# Patient Record
Sex: Female | Born: 1985 | Race: White | Hispanic: No | Marital: Married | Smoking: Never smoker
Health system: Southern US, Community
[De-identification: ages and names within clinical notes are randomized; demographics above are authoritative.]

## PROBLEM LIST (undated history)

## (undated) DIAGNOSIS — D649 Anemia, unspecified: Secondary | ICD-10-CM

## (undated) DIAGNOSIS — N946 Dysmenorrhea, unspecified: Secondary | ICD-10-CM

## (undated) DIAGNOSIS — G43909 Migraine, unspecified, not intractable, without status migrainosus: Secondary | ICD-10-CM

## (undated) DIAGNOSIS — N979 Female infertility, unspecified: Secondary | ICD-10-CM

## (undated) DIAGNOSIS — T7840XA Allergy, unspecified, initial encounter: Secondary | ICD-10-CM

## (undated) DIAGNOSIS — K219 Gastro-esophageal reflux disease without esophagitis: Secondary | ICD-10-CM

## (undated) HISTORY — DX: Dysmenorrhea, unspecified: N94.6

## (undated) HISTORY — DX: Migraine, unspecified, not intractable, without status migrainosus: G43.909

## (undated) HISTORY — DX: Allergy, unspecified, initial encounter: T78.40XA

## (undated) HISTORY — PX: WISDOM TOOTH EXTRACTION: SHX21

---
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
## 2010-04-09 ENCOUNTER — Emergency Department (HOSPITAL_COMMUNITY): Admission: EM | Admit: 2010-04-09 | Discharge: 2010-04-10 | Payer: Self-pay | Admitting: Emergency Medicine

## 2012-03-14 ENCOUNTER — Ambulatory Visit (INDEPENDENT_AMBULATORY_CARE_PROVIDER_SITE_OTHER): Payer: BC Managed Care – PPO | Admitting: Family Medicine

## 2012-03-14 ENCOUNTER — Encounter: Payer: Self-pay | Admitting: Family Medicine

## 2012-03-14 VITALS — BP 122/80 | HR 56 | Temp 98.3°F | Resp 16 | Ht 65.5 in | Wt 231.2 lb

## 2012-03-14 DIAGNOSIS — M62838 Other muscle spasm: Secondary | ICD-10-CM

## 2012-03-14 DIAGNOSIS — G43109 Migraine with aura, not intractable, without status migrainosus: Secondary | ICD-10-CM

## 2012-03-14 DIAGNOSIS — R51 Headache: Secondary | ICD-10-CM

## 2012-03-14 LAB — CBC WITH DIFFERENTIAL/PLATELET
Basophils Absolute: 0 10*3/uL (ref 0.0–0.1)
Basophils Relative: 0 % (ref 0–1)
MCHC: 34.2 g/dL (ref 30.0–36.0)
Monocytes Absolute: 0.5 10*3/uL (ref 0.1–1.0)
Neutro Abs: 4 10*3/uL (ref 1.7–7.7)
Neutrophils Relative %: 59 % (ref 43–77)
Platelets: 354 10*3/uL (ref 150–400)
RDW: 13.5 % (ref 11.5–15.5)

## 2012-03-14 MED ORDER — RIZATRIPTAN BENZOATE 5 MG PO TABS: 5.0000 mg | ORAL_TABLET | ORAL | Status: DC | PRN

## 2012-03-14 MED ORDER — CYCLOBENZAPRINE HCL 5 MG PO TABS: 5.0000 mg | ORAL_TABLET | Freq: Two times a day (BID) | ORAL | Status: AC | PRN

## 2012-03-14 MED ORDER — DIVALPROEX SODIUM 250 MG PO DR TAB: 250.0000 mg | DELAYED_RELEASE_TABLET | Freq: Two times a day (BID) | ORAL | Status: DC

## 2012-03-14 NOTE — Patient Instructions (Signed)

## 2012-03-15 LAB — T4, FREE: Free T4: 1.19 ng/dL (ref 0.80–1.80)

## 2012-03-15 LAB — VITAMIN D 25 HYDROXY (VIT D DEFICIENCY, FRACTURES): Vit D, 25-Hydroxy: 21 ng/mL — ABNORMAL LOW (ref 30–89)

## 2012-03-15 LAB — COMPREHENSIVE METABOLIC PANEL
Albumin: 4.4 g/dL (ref 3.5–5.2)
BUN: 11 mg/dL (ref 6–23)
Calcium: 9.3 mg/dL (ref 8.4–10.5)
Chloride: 104 mEq/L (ref 96–112)
Creat: 0.67 mg/dL (ref 0.50–1.10)
Glucose, Bld: 84 mg/dL (ref 70–99)
Potassium: 4.6 mEq/L (ref 3.5–5.3)

## 2012-03-15 LAB — TSH: TSH: 0.422 u[IU]/mL (ref 0.350–4.500)

## 2012-03-16 ENCOUNTER — Encounter: Payer: Self-pay | Admitting: Family Medicine

## 2012-03-16 DIAGNOSIS — G43109 Migraine with aura, not intractable, without status migrainosus: Secondary | ICD-10-CM | POA: Insufficient documentation

## 2012-03-16 MED ORDER — ERGOCALCIFEROL 1.25 MG (50000 UT) PO CAPS: 50000.0000 [IU] | ORAL_CAPSULE | ORAL | Status: AC

## 2012-03-16 NOTE — Addendum Note (Signed)
Addended by: Dow Adolph B on: 03/16/2012 06:42 PM   Modules accepted: Orders

## 2012-03-16 NOTE — Progress Notes (Signed)
Quick Note:  Please call pt and advise that the following labs are abnormal... Vitamin D is low. I have prescribed Vit D 50,000 IU to be taken once a week. Pick up medication from your pharmacy and take it until there are no RFs. (Then you get OTC Vit D 2000 IU and take this supplement daily)  All other labs are normal.  Copy to pt. ______

## 2012-03-16 NOTE — Progress Notes (Signed)
  Subjective:    Patient ID: Helen Yates, female    DOB: 31-Jan-1986, 26 y.o.   MRN: [id number]  HPI  This 26 y.o. Cauc female is here for evaluation and treatment of Migraines. She was taking Midrin  prescribed at [facility]; side-effect was drowsiness. HAs started in high school; she  experiences scotomata (but no vision loss or numbness) along with nausea and vomiting. Frequency: 2x/ week.  Location: frontal area but sometimes at base of skull- neck massage helps. Father has Migraines as does brother.    Review of Systems As per HPI     Objective:   Physical Exam  Nursing note and vitals reviewed. Constitutional: She is oriented to person, place, and time. She appears well-developed and well-nourished. No distress.  HENT:  Head: Normocephalic and atraumatic.  Right Ear: External ear normal.  Left Ear: External ear normal.  Nose: Nose normal.  Mouth/Throat: Oropharynx is clear and moist.  Eyes: Conjunctivae and EOM are normal. Pupils are equal, round, and reactive to light. No scleral icterus.  Neck: Normal range of motion. Neck supple. No thyromegaly present.       Mild tenderness at nape of neck  Cardiovascular: Normal rate, regular rhythm and normal heart sounds.  Exam reveals no gallop and no friction rub.   No murmur heard. Pulmonary/Chest: Effort normal and breath sounds normal. No respiratory distress. She has no wheezes.  Abdominal: Soft. Bowel sounds are normal. She exhibits no mass. There is no tenderness. There is no guarding.       No organomegaly  Musculoskeletal: Normal range of motion. She exhibits no edema and no tenderness.  Lymphadenopathy:    She has no cervical adenopathy.  Neurological: She is alert and oriented to person, place, and time. She has normal reflexes. No cranial nerve deficit. She exhibits normal muscle tone. Coordination normal.  Skin: Skin is warm and dry. No rash noted. No erythema.  Psychiatric: She has a normal mood and  affect. Her behavior is normal. Judgment and thought content normal.          Assessment & Plan:   1. Headache - neck muscle spasms may be a trigger so trial of muscle relaxant may be helpful Comprehensive metabolic panel, Vitamin D, 25-hydroxy, TSH, T4, Free, CBC with Differential RX: Cyclobenzaprine 5 mg 1 tab bid prn neck pain  2. Migraine with aura  Trial Depakote 250 mg bid for prophylaxis since Headache frequency is 2-3x weekly. She asked about this medication with pregnancy (she is considering conception in 1-2 years). I have advised pre-conception counseling with regards to safety of this medication during pregnancy. She understands.

## 2012-03-17 ENCOUNTER — Encounter: Payer: Self-pay | Admitting: Family Medicine

## 2012-05-27 ENCOUNTER — Ambulatory Visit (INDEPENDENT_AMBULATORY_CARE_PROVIDER_SITE_OTHER): Payer: BC Managed Care – PPO | Admitting: Family Medicine

## 2012-05-27 ENCOUNTER — Encounter: Payer: Self-pay | Admitting: Family Medicine

## 2012-05-27 VITALS — BP 114/72 | HR 64 | Temp 98.0°F | Resp 16

## 2012-05-27 DIAGNOSIS — G43909 Migraine, unspecified, not intractable, without status migrainosus: Secondary | ICD-10-CM

## 2012-05-27 DIAGNOSIS — N912 Amenorrhea, unspecified: Secondary | ICD-10-CM

## 2012-05-27 DIAGNOSIS — E559 Vitamin D deficiency, unspecified: Secondary | ICD-10-CM

## 2012-05-27 DIAGNOSIS — N911 Secondary amenorrhea: Secondary | ICD-10-CM | POA: Insufficient documentation

## 2012-05-27 DIAGNOSIS — G43809 Other migraine, not intractable, without status migrainosus: Secondary | ICD-10-CM

## 2012-05-27 LAB — POCT URINE PREGNANCY: Preg Test, Ur: NEGATIVE

## 2012-05-27 MED ORDER — DIVALPROEX SODIUM 250 MG PO DR TAB: 250.0000 mg | DELAYED_RELEASE_TABLET | Freq: Two times a day (BID) | ORAL | Status: DC

## 2012-05-27 NOTE — Patient Instructions (Addendum)
You can also try OTC Vitamin B12  1 mg daily for migraine headache prevention.  Re: Vitamin D def- continue prescription supplement once a week until you have no refills remaining then get OTC Vit D 2000 IU and take it daily along with a MVI for young women. [employer] is a good brand).

## 2012-05-27 NOTE — Progress Notes (Signed)
S; This 26 y.o. Cauc female has a migraine headache disorder which is responding well to Depakote 250 mg twice a day. Her headaches are less frequent and not as intense. Taking Cyclobenzaprine 5 mg helps with minor headaches.   She and her husband are not planning any children in the immediate future; she did miss menses in July but has skipped months in the past. "Cheap pregnancy test" was negative at home. She would like confirmation testing here.  O: Filed Vitals:   05/27/12 1618  BP: 114/72  Pulse: 64  Temp: 98 F (36.7 C)  Resp: 16   GEN: In NAD COR:RRR; no edema LUNGS: Normal resp rate and effort NEURO: A&O x 3; CNs intact; otherwise, nonfocal.     POCT URINE PREGNANCY      Component Value Range   Preg Test, Ur Negative         A/P: 1. Migraine variant  Continue Depakote 250 mg 1 tab bid Use muscle relaxant prn  2. Amenorrhea, secondary  POCT urine pregnancy- negative    3.  Vitamin D deficiency                                           Continue Vitamin D 50,000 IU  1 capsule once a week

## 2013-03-31 ENCOUNTER — Ambulatory Visit (INDEPENDENT_AMBULATORY_CARE_PROVIDER_SITE_OTHER): Payer: BC Managed Care – PPO | Admitting: Family Medicine

## 2013-03-31 VITALS — BP 134/81 | HR 85 | Temp 98.0°F | Resp 18 | Ht 66.0 in | Wt 248.0 lb

## 2013-03-31 DIAGNOSIS — M545 Low back pain, unspecified: Secondary | ICD-10-CM

## 2013-03-31 MED ORDER — PREDNISONE 20 MG PO TABS: ORAL_TABLET | ORAL | Status: DC

## 2013-03-31 MED ORDER — HYDROCODONE-ACETAMINOPHEN 5-325 MG PO TABS: 1.0000 | ORAL_TABLET | Freq: Four times a day (QID) | ORAL | Status: DC | PRN

## 2013-03-31 NOTE — Patient Instructions (Addendum)

## 2013-03-31 NOTE — Progress Notes (Signed)
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 It's gotten quite severe and she can't go to work at this point. He says the pain is in her low back and is worse when she gets up from a chair or bends over. She recently had a. But the back pain she is describing is different from menstrual cramps.  Patient's had an episode of low back pain almost every month for the last 6 months but is been mild and bearable, resolving a couple days.  Patient's not having any fever, trouble with legs giving out, urination problems, bowel problems, or abdominal pain.  Objective: No acute distress, patient moves slowly and carefully and she negotiates the room. Palpation of the low back reveals some mild tenderness over the LS-spine region bilaterally Skin is without rash Straight-leg raising is mildly positive when seated bilaterally Reflexes are normal in the ankles and knees with symmetry bilaterally. Motor strength is normal in both legs as she is able to stand on her toes, walk on her heels, and left each leg separately.  Assessment: This is easily 2 weight Plan: Advised patient to try to lose some weight and we discussed her diet. We also discussed some exercise with walking in the portal and portal exercises. If she's not getting better in 48 hours she is to return for further workup Low back pain - Plan: predniSONE (DELTASONE) 20 MG tablet, HYDROcodone-acetaminophen (NORCO) 5-325 MG per tablet  Signed, Elvina Sidle, MD

## 2013-07-07 ENCOUNTER — Encounter: Payer: Self-pay | Admitting: Certified Nurse Midwife

## 2013-07-07 ENCOUNTER — Ambulatory Visit (INDEPENDENT_AMBULATORY_CARE_PROVIDER_SITE_OTHER): Payer: BC Managed Care – PPO | Admitting: Certified Nurse Midwife

## 2013-07-07 VITALS — BP 104/66 | HR 64 | Resp 16 | Ht 65.25 in | Wt 247.0 lb

## 2013-07-07 DIAGNOSIS — Z23 Encounter for immunization: Secondary | ICD-10-CM

## 2013-07-07 DIAGNOSIS — Z0000 Encounter for general adult medical examination without abnormal findings: Secondary | ICD-10-CM

## 2013-07-07 DIAGNOSIS — Z01419 Encounter for gynecological examination (general) (routine) without abnormal findings: Secondary | ICD-10-CM

## 2013-07-07 LAB — COMPREHENSIVE METABOLIC PANEL
ALT: 13 U/L (ref 0–35)
AST: 14 U/L (ref 0–37)
Creat: 0.64 mg/dL (ref 0.50–1.10)
Total Bilirubin: 0.5 mg/dL (ref 0.3–1.2)

## 2013-07-07 LAB — POCT URINALYSIS DIPSTICK
Glucose, UA: NEGATIVE
Nitrite, UA: NEGATIVE
Urobilinogen, UA: NEGATIVE

## 2013-07-07 LAB — HEMOGLOBIN A1C: Hgb A1c MFr Bld: 5.6 % (ref <5.7)

## 2013-07-07 LAB — LIPID PANEL
Cholesterol: 162 mg/dL (ref 0–200)
HDL: 53 mg/dL (ref >39)
Total CHOL/HDL Ratio: 3.1 Ratio
VLDL: 23 mg/dL (ref 0–40)

## 2013-07-07 LAB — HEMOGLOBIN, FINGERSTICK: Hemoglobin, fingerstick: 14.8 g/dL (ref 12.0–16.0)

## 2013-07-07 NOTE — Progress Notes (Signed)
27 y.o. G0P0000 Married Caucasian Fe here to establish gyn care and for annual exam. Periods have been in regular except for one missed period per year. Describes periods as painful and heavy for first two days with duration 5 days. Uses Advil 800 mg for first two days. Contraception none in past year and half, trying to conceive. Patient given Depo with rash reaction.   Patient's last menstrual period was 06/14/2013.          Sexually active: yes  The current method of family planning is none.    Exercising: no  exercise Smoker:  no  Health Maintenance: Pap:  2011 MMG:  none Colonoscopy:  none BMD:   none TDaP:  2008 Labs: Poct urine-neg, Hgb-14.8 Self breast exam: not done   reports that she has never smoked. She does not have any smokeless tobacco history on file. She reports that she does not drink alcohol or use illicit drugs.  Past Medical History  Diagnosis Date  . Allergy   . Migraines   . Dysmenorrhea     Past Surgical History  Procedure Laterality Date  . Wisdom tooth extraction      No current outpatient prescriptions on file.   No current facility-administered medications for this visit.    Family History  Problem Relation Age of Onset  . Fibromyalgia Mother   . Osteoporosis Mother   . Migraines Father   . Hypertension Father   . Heart disease Maternal Grandfather   . Diabetes Maternal Grandfather   . Breast cancer Paternal Grandmother     twice  . Diabetes Paternal Grandmother     ROS:  Pertinent items are noted in HPI.  Otherwise, a comprehensive ROS was negative.  Exam:   BP 104/66  Pulse 64  Resp 16  Ht 5' 5.25" (1.657 m)  Wt 247 lb (112.038 kg)  BMI 40.81 kg/m2  LMP 06/14/2013 Height: 5' 5.25" (165.7 cm)  Ht Readings from Last 3 Encounters:  07/07/13 5' 5.25" (1.657 m)  03/31/13 5\' 6"  (1.676 m)  03/14/12 5' 5.5" (1.664 m)    General appearance: alert, cooperative and appears stated age Head: Normocephalic, without obvious abnormality,  atraumatic Neck: no adenopathy, supple, symmetrical, trachea midline and thyroid normal to inspection and palpation Lungs: clear to auscultation bilaterally Breasts: normal appearance, no masses or tenderness, No nipple retraction or dimpling, No nipple discharge or bleeding, No axillary or supraclavicular adenopathy, Taught monthly breast self examination Heart: regular rate and rhythm Abdomen: soft, non-tender; no masses,  no organomegaly Extremities: extremities normal, atraumatic, no cyanosis or edema Skin: Skin color, texture, turgor normal. No rashes or lesions Lymph nodes: Cervical, supraclavicular, and axillary nodes normal. No abnormal inguinal nodes palpated Neurologic: Grossly normal   Pelvic: External genitalia:  no lesions              Urethra:  normal appearing urethra with no masses, tenderness or lesions              Bartholin's and Skene's: normal                 Vagina: normal appearing vagina with normal color and discharge, no lesions              Cervix: normal,  non tender              Pap taken: yes Bimanual Exam:  Uterus:  normal size, contour, position, consistency, mobility, non-tender and mid position  Adnexa: normal adnexa and no mass, fullness, tenderness               Rectovaginal: Confirms               Anus:  normal sphincter tone, no lesions  A:  Well Woman with normal exam  Contraception none desired, attempting pregnancy  History of Migraine with aura  Morbid obesity  P:   Reviewed health and wellness pertinent to exam  Discussed importance of Prenatal vitamins daily with pregnancy and no medication use unless needed  Aware of warning signs and symptoms with migraine  Discussed regular exercise helps with menses and fertility. Encouraged weight loss prior to pregnancy to decrease risk of complications  Pap smear as per guidelines   pap smear taken today  Labs:TSH,CMP,Lipid panel, Hgb A1c  counseled on breast self exam, adequate  intake of calcium and vitamin D, diet and exercise  return annually or prn  An After Visit Summary was printed and given to the patient.

## 2013-07-07 NOTE — Patient Instructions (Signed)
General topics  Next pap or exam is  due in 1 year Take a Women's multivitamin Take 1200 mg. of calcium daily - prefer dietary If any concerns in interim to call back  Breast Self-Awareness Practicing breast self-awareness may pick up problems early, prevent significant medical complications, and possibly save your life. By practicing breast self-awareness, you can become familiar with how your breasts look and feel and if your breasts are changing. This allows you to notice changes early. It can also offer you some reassurance that your breast health is good. One way to learn what is normal for your breasts and whether your breasts are changing is to do a breast self-exam. If you find a lump or something that was not present in the past, it is best to contact your caregiver right away. Other findings that should be evaluated by your caregiver include nipple discharge, especially if it is bloody; skin changes or reddening; areas where the skin seems to be pulled in (retracted); or new lumps and bumps. Breast pain is seldom associated with cancer (malignancy), but should also be evaluated by a caregiver. BREAST SELF-EXAM The best time to examine your breasts is 5 7 days after your menstrual period is over.  ExitCare Patient Information 2013 [employer].   Exercise to Stay Healthy Exercise helps you become and stay healthy. EXERCISE IDEAS AND TIPS Choose exercises that:  You enjoy.  Fit into your day. You do not need to exercise really hard to be healthy. You can do exercises at a slow or medium level and stay healthy. You can:  Stretch before and after working out.  Try yoga, Pilates, or tai chi.  Lift weights.  Walk fast, swim, jog, run, climb stairs, bicycle, dance, or rollerskate.  Take aerobic classes. Exercises that burn about 150 calories:  Running 1  miles in 15 minutes.  Playing volleyball for 45 to 60 minutes.  Washing and waxing a car for 45 to 60  minutes.  Playing touch football for 45 minutes.  Walking 1  miles in 35 minutes.  Pushing a stroller 1  miles in 30 minutes.  Playing basketball for 30 minutes.  Raking leaves for 30 minutes.  Bicycling 5 miles in 30 minutes.  Walking 2 miles in 30 minutes.  Dancing for 30 minutes.  Shoveling snow for 15 minutes.  Swimming laps for 20 minutes.  Walking up stairs for 15 minutes.  Bicycling 4 miles in 15 minutes.  Gardening for 30 to 45 minutes.  Jumping rope for 15 minutes.  Washing windows or floors for 45 to 60 minutes. Document Released: 10/20/2010 Document Revised: 12/10/2011 Document Reviewed: 10/20/2010 ExitCare Patient Information 2013 [employer].   Other topics ( that may be useful information):    Sexually Transmitted Disease Sexually transmitted disease (STD) refers to any infection that is passed from person to person during sexual activity. This may happen by way of saliva, semen, blood, vaginal mucus, or urine. Common STDs include:  Gonorrhea.  Chlamydia.  Syphilis.  HIV/AIDS.  Genital herpes.  Hepatitis B and C.  Trichomonas.  Human papillomavirus (HPV).  Pubic lice. CAUSES  An STD may be spread by bacteria, virus, or parasite. A person can get an STD by:  Sexual intercourse with an infected person.  Sharing sex toys with an infected person.  Sharing needles with an infected person.  Having intimate contact with the genitals, mouth, or rectal areas of an infected person. SYMPTOMS  Some people may not have any symptoms, but   they can still pass the infection to others. Different STDs have different symptoms. Symptoms include:  Painful or bloody urination.  Pain in the pelvis, abdomen, vagina, anus, throat, or eyes.  Skin rash, itching, irritation, growths, or sores (lesions). These usually occur in the genital or anal area.  Abnormal vaginal discharge.  Penile discharge in men.  Soft, flesh-colored skin growths in the  genital or anal area.  Fever.  Pain or bleeding during sexual intercourse.  Swollen glands in the groin area.  Yellow skin and eyes (jaundice). This is seen with hepatitis. DIAGNOSIS  To make a diagnosis, your caregiver may:  Take a medical history.  Perform a physical exam.  Take a specimen (culture) to be examined.  Examine a sample of discharge under a microscope.  Perform blood test TREATMENT   Chlamydia, gonorrhea, trichomonas, and syphilis can be cured with antibiotic medicine.  Genital herpes, hepatitis, and HIV can be treated, but not cured, with prescribed medicines. The medicines will lessen the symptoms.  Genital warts from HPV can be treated with medicine or by freezing, burning (electrocautery), or surgery. Warts may come back.  HPV is a virus and cannot be cured with medicine or surgery.However, abnormal areas may be followed very closely by your caregiver and may be removed from the cervix, vagina, or vulva through office procedures or surgery. If your diagnosis is confirmed, your recent sexual partners need treatment. This is true even if they are symptom-free or have a negative culture or evaluation. They should not have sex until their caregiver says it is okay. HOME CARE INSTRUCTIONS  All sexual partners should be informed, tested, and treated for all STDs.  Take your antibiotics as directed. Finish them even if you start to feel better.  Only take over-the-counter or prescription medicines for pain, discomfort, or fever as directed by your caregiver.  Rest.  Eat a balanced diet and drink enough fluids to keep your urine clear or pale yellow.  Do not have sex until treatment is completed and you have followed up with your caregiver. STDs should be checked after treatment.  Keep all follow-up appointments, Pap tests, and blood tests as directed by your caregiver.  Only use latex condoms and water-soluble lubricants during sexual activity. Do not use  petroleum jelly or oils.  Avoid alcohol and illegal drugs.  Get vaccinated for HPV and hepatitis. If you have not received these vaccines in the past, talk to your caregiver about whether one or both might be right for you.  Avoid risky sex practices that can break the skin. The only way to avoid getting an STD is to avoid all sexual activity.Latex condoms and dental dams (for oral sex) will help lessen the risk of getting an STD, but will not completely eliminate the risk. SEEK MEDICAL CARE IF:   You have a fever.  You have any new or worsening symptoms. Document Released: 12/08/2002 Document Revised: 12/10/2011 Document Reviewed: 12/15/2010 ExitCare Patient Information 2013 [employer].    Domestic Abuse You are being battered or abused if someone close to you hits, pushes, or physically hurts you in any way. You also are being abused if you are forced into activities. You are being sexually abused if you are forced to have sexual contact of any kind. You are being emotionally abused if you are made to feel worthless or if you are constantly threatened. It is important to remember that help is available. No one has the right to abuse you. PREVENTION OF FURTHER   ABUSE  Learn the warning signs of danger. This varies with situations but may include: the use of alcohol, threats, isolation from friends and family, or forced sexual contact. Leave if you feel that violence is going to occur.  If you are attacked or beaten, report it to the police so the abuse is documented. You do not have to press charges. The police can protect you while you or the attackers are leaving. Get the officer's name and badge number and a copy of the report.  Find someone you can trust and tell them what is happening to you: your caregiver, a nurse, clergy member, close friend or family member. Feeling ashamed is natural, but remember that you have done nothing wrong. No one deserves abuse. Document Released:  09/14/2000 Document Revised: 12/10/2011 Document Reviewed: 11/23/2010 ExitCare Patient Information 2013 [employer].    How Much is Too Much Alcohol? Drinking too much alcohol can cause injury, accidents, and health problems. These types of problems can include:   Car crashes.  Falls.  Family fighting (domestic violence).  Drowning.  Fights.  Injuries.  Burns.  Damage to certain organs.  Having a baby with birth defects. ONE DRINK CAN BE TOO MUCH WHEN YOU ARE:  Working.  Pregnant or breastfeeding.  Taking medicines. Ask your doctor.  Driving or planning to drive. If you or someone you know has a drinking problem, get help from a doctor.  Document Released: 07/14/2009 Document Revised: 12/10/2011 Document Reviewed: 07/14/2009 ExitCare Patient Information 2013 [employer].   Smoking Hazards Smoking cigarettes is extremely bad for your health. Tobacco smoke has over 200 known poisons in it. There are over 60 chemicals in tobacco smoke that cause cancer. Some of the chemicals found in cigarette smoke include:   Cyanide.  Benzene.  Formaldehyde.  Methanol (wood alcohol).  Acetylene (fuel used in welding torches).  Ammonia. Cigarette smoke also contains the poisonous gases nitrogen oxide and carbon monoxide.  Cigarette smokers have an increased risk of many serious medical problems and Smoking causes approximately:  90% of all lung cancer deaths in men.  80% of all lung cancer deaths in women.  90% of deaths from chronic obstructive lung disease. Compared with nonsmokers, smoking increases the risk of:  Coronary heart disease by 2 to 4 times.  Stroke by 2 to 4 times.  Men developing lung cancer by 23 times.  Women developing lung cancer by 13 times.  Dying from chronic obstructive lung diseases by 12 times.  . Smoking is the most preventable cause of death and disease in our society.  WHY IS SMOKING ADDICTIVE?  Nicotine is the chemical  agent in tobacco that is capable of causing addiction or dependence.  When you smoke and inhale, nicotine is absorbed rapidly into the bloodstream through your lungs. Nicotine absorbed through the lungs is capable of creating a powerful addiction. Both inhaled and non-inhaled nicotine may be addictive.  Addiction studies of cigarettes and spit tobacco show that addiction to nicotine occurs mainly during the teen years, when young people begin using tobacco products. WHAT ARE THE BENEFITS OF QUITTING?  There are many health benefits to quitting smoking.   Likelihood of developing cancer and heart disease decreases. Health improvements are seen almost immediately.  Blood pressure, pulse rate, and breathing patterns start returning to normal soon after quitting. QUITTING SMOKING   American Lung Association - 1-800-LUNGUSA  American Cancer Society - 1-800-ACS-2345 Document Released: 10/25/2004 Document Revised: 12/10/2011 Document Reviewed: 06/29/2009 ExitCare Patient Information 2013 [employer]   LL[employer].   Stress Management Stress is a state of physical or mental tension that often results from changes in your life or normal routine. Some common causes of stress are:  Death of a loved one.  Injuries or severe illnesses.  Getting fired or changing jobs.  Moving into a new home. Other causes may be:  Sexual problems.  Business or financial losses.  Taking on a large debt.  Regular conflict with someone at home or at work.  Constant tiredness from lack of sleep. It is not just bad things that are stressful. It may be stressful to:  Win the lottery.  Get married.  Buy a new car. The amount of stress that can be easily tolerated varies from person to person. Changes generally cause stress, regardless of the types of change. Too much stress can affect your health. It may lead to physical or emotional problems. Too little stress (boredom) may also become stressful. SUGGESTIONS TO  REDUCE STRESS:  Talk things over with your family and friends. It often is helpful to share your concerns and worries. If you feel your problem is serious, you may want to get help from a professional counselor.  Consider your problems one at a time instead of lumping them all together. Trying to take care of everything at once may seem impossible. List all the things you need to do and then start with the most important one. Set a goal to accomplish 2 or 3 things each day. If you expect to do too many in a single day you will naturally fail, causing you to feel even more stressed.  Do not use alcohol or drugs to relieve stress. Although you may feel better for a short time, they do not remove the problems that caused the stress. They can also be habit forming.  Exercise regularly - at least 3 times per week. Physical exercise can help to relieve that "uptight" feeling and will relax you.  The shortest distance between despair and hope is often a good night's sleep.  Go to bed and get up on time allowing yourself time for appointments without being rushed.  Take a short "time-out" period from any stressful situation that occurs during the day. Close your eyes and take some deep breaths. Starting with the muscles in your face, tense them, hold it for a few seconds, then relax. Repeat this with the muscles in your neck, shoulders, hand, stomach, back and legs.  Take good care of yourself. Eat a balanced diet and get plenty of rest.  Schedule time for having fun. Take a break from your daily routine to relax. HOME CARE INSTRUCTIONS   Call if you feel overwhelmed by your problems and feel you can no longer manage them on your own.  Return immediately if you feel like hurting yourself or someone else. Document Released: 03/13/2001 Document Revised: 12/10/2011 Document Reviewed: 11/03/2007 ExitCare Patient Information 2013 [employer].   

## 2013-07-08 ENCOUNTER — Ambulatory Visit (INDEPENDENT_AMBULATORY_CARE_PROVIDER_SITE_OTHER): Payer: BC Managed Care – PPO | Admitting: Family Medicine

## 2013-07-08 VITALS — BP 118/92 | HR 70 | Temp 98.6°F | Resp 16 | Ht 65.0 in | Wt 246.6 lb

## 2013-07-08 DIAGNOSIS — M79609 Pain in unspecified limb: Secondary | ICD-10-CM

## 2013-07-08 DIAGNOSIS — M79674 Pain in right toe(s): Secondary | ICD-10-CM

## 2013-07-08 DIAGNOSIS — L603 Nail dystrophy: Secondary | ICD-10-CM

## 2013-07-08 DIAGNOSIS — L608 Other nail disorders: Secondary | ICD-10-CM

## 2013-07-08 LAB — IPS PAP TEST WITH REFLEX TO HPV

## 2013-07-08 NOTE — Progress Notes (Signed)
Verbal consent obtained. Digital block with 2% lidocaine plain, 7 CCs total given. Area was prepped and draped in a sterile fashion. Nail lifted and removed en toto. Xeroform placed. Cleansed and dressed.   Patient Instructions  INGROWN TOENAIL . Keep area clean, dry and bandaged for 24 hours. . After 24 hours, remove outer bandage and leave yellow gauze in place. Nuala Alpha toe/foot in warm soapy water for 5-10 minutes, once daily for 5 days. Rebandage toe after each cleaning. . Continue soaks until yellow gauze falls off. . Notify the office if you experience any of the following signs of infection: Swelling, redness, pus drainage, streaking, fever > 101.0 F

## 2013-07-08 NOTE — Progress Notes (Signed)
This chart was scribed for Dr. Milus Glazier, MD by Ardelia Mems, Scribe. The patient was seen in Room 2 and the patient's care was started at 7:23 PM.  Subjective:    Patient ID: Helen Yates, female    DOB: Feb 23, 1986, 27 y.o.   MRN: [id number]  Toe Pain  The incident occurred 6 to 12 hours ago. The incident occurred at home. The injury mechanism was a direct blow (stumped her toe). The pain is present in the right toes (right great toe). The pain is moderate. The pain has been constant since onset. She reports no foreign bodies present. The symptoms are aggravated by palpation, weight bearing and movement. She has tried nothing for the symptoms.    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 She complains today of an injury to her right great toe onset this morning. She states that the toenail has been irritated and as possibly had a fungal infection "for some time". She also states that she injured the toenail this morning, when she was walking and accidentally stumped the toe. She states that she has stumped the toe multiple times in the past. She states that there was bleeding from the toe nail today, after she stumped it.   Past Medical History  Diagnosis Date  . Allergy   . Migraines   . Dysmenorrhea    Past Surgical History  Procedure Laterality Date  . Wisdom tooth extraction      Review of Systems  Musculoskeletal:       Right great toe pain      Objective:   Physical Exam  Nursing note and vitals reviewed. Constitutional: She is oriented to person, place, and time. She appears well-developed and well-nourished.  HENT:  Head: Normocephalic and atraumatic.  Eyes: EOM are normal. Pupils are equal, round, and reactive to light.  Neck: Normal range of motion. Neck supple.  Cardiovascular: Normal rate and regular rhythm.   Pulmonary/Chest: Effort normal. No respiratory distress.  Abdominal: Soft. There is no tenderness.  Neurological: She is alert and oriented to person,  place, and time.  Skin: Skin is warm. No rash noted.       Assessment & Plan:   Toe pain, right  Dystrophic nail    Signed, Elvina Sidle, MD   This chart was scribed in my presence and reviewed by me personally.

## 2013-07-08 NOTE — Patient Instructions (Signed)
INGROWN TOENAIL . Keep area clean, dry and bandaged for 24 hours. . After 24 hours, remove outer bandage and leave yellow gauze in place. . Soak toe/foot in warm soapy water for 5-10 minutes, once daily for 5 days. Rebandage toe after each cleaning. . Continue soaks until yellow gauze falls off. . Notify the office if you experience any of the following signs of infection: Swelling, redness, pus drainage, streaking, fever > 101.0 F   

## 2013-07-08 NOTE — Progress Notes (Signed)
I directly supervised and participated in the procedure and agree with the student's documentation.  

## 2013-07-14 ENCOUNTER — Telehealth: Payer: Self-pay

## 2013-07-14 NOTE — Telephone Encounter (Signed)
lmtcb

## 2013-07-14 NOTE — Telephone Encounter (Signed)
Message copied by Eliezer Bottom on Tue Jul 14, 2013 11:31 AM ------      Message from: Verner Chol      Created: Tue Jul 14, 2013  8:26 AM       Notify Liver, kidney, glucose profile normal,      Lipid panel normal, TSH normal      Pap smear negative.  Patient needs appointment to review ovulation chart in a month, please schedule      02 ------

## 2013-07-14 NOTE — Telephone Encounter (Signed)
Patient notified

## 2013-07-16 NOTE — Progress Notes (Signed)
Note reviewed, agree with plan.  Alia Parsley, MD  

## 2013-08-12 ENCOUNTER — Telehealth: Payer: Self-pay | Admitting: Certified Nurse Midwife

## 2013-08-12 NOTE — Telephone Encounter (Addendum)
Helen Yates with [employer] calling regarding this patients claim. (coordination of benefits not in order).

## 2013-08-14 ENCOUNTER — Ambulatory Visit (INDEPENDENT_AMBULATORY_CARE_PROVIDER_SITE_OTHER): Payer: BC Managed Care – PPO | Admitting: Certified Nurse Midwife

## 2013-08-14 ENCOUNTER — Encounter: Payer: Self-pay | Admitting: Certified Nurse Midwife

## 2013-08-14 VITALS — BP 108/74 | HR 76 | Ht 65.25 in | Wt 249.0 lb

## 2013-08-14 DIAGNOSIS — N912 Amenorrhea, unspecified: Secondary | ICD-10-CM

## 2013-08-14 DIAGNOSIS — Z3002 Counseling and instruction in natural family planning to avoid pregnancy: Secondary | ICD-10-CM

## 2013-08-14 LAB — POCT URINE PREGNANCY: Preg Test, Ur: NEGATIVE

## 2013-08-14 NOTE — Progress Notes (Signed)
27 y.o. Married Caucasian female [id number] here for follow up of ovulation chart evaluation for natural family planning and conception. Periods have been regular now for the past two months. Prior history of irregular cycles for over 6 months. Has been checking temperature and watching cervical mucous for change. Cycles were 31-32 day in length. Patient did not feel she saw a mucous change, but did see a drop in temperature with one cycle around day 16 with a subsequent rise. Period now due in a few days if regular this month. Taking prenatal vitamins.   O: Healthy WD,WN female Affect: Normal, orientation x 3  POCT UPT: negative    A: Evaluation of ovulation graph for attempt at conception planning Ovulation pattern noted on one graph 32 day cycle   P: Discussed findings of consistent with ovulation pattern. Instructed patient to continue with record and confirm ovulation if pattern the same with ovulation kit. Recommend staying in bed prone after intercourse for at least one hour rather up within 10 minutes as before to help with sperm travel. Questions addressed. Review again at 3 months if no conception. Declines rubella screen   RV 3 months, prn   23 minutes spent with patient with >50% of time spent in face to face counseling.

## 2013-08-18 NOTE — Progress Notes (Signed)
Note reviewed, agree with plan.  Terah Robey, MD  

## 2013-10-30 ENCOUNTER — Telehealth: Payer: Self-pay | Admitting: Certified Nurse Midwife

## 2013-10-30 NOTE — Telephone Encounter (Signed)
Pt has not had a cycle since Nov and she has taken 5 pregnancy test they all were negative.

## 2013-10-30 NOTE — Telephone Encounter (Signed)
Spoke with patient. She has been actively trying to conceive per patient for approximately 1.5 years without pregnancy.  Has not had a cycle since 08/19/13 and had negative pregnancy tests at home.  Advised can see MD to discuss fertility and patient agreeable.  Scheduled with Dr. Hyacinth MeekerMiller for 2/3 at 0830. Patient agreeable.  Routing to provider for final review. Patient agreeable to disposition. Will close encounter

## 2013-11-03 ENCOUNTER — Telehealth: Payer: Self-pay | Admitting: Obstetrics & Gynecology

## 2013-11-03 ENCOUNTER — Ambulatory Visit (INDEPENDENT_AMBULATORY_CARE_PROVIDER_SITE_OTHER): Payer: BC Managed Care – PPO | Admitting: Obstetrics & Gynecology

## 2013-11-03 VITALS — BP 114/78 | HR 60 | Resp 12 | Ht 65.25 in | Wt 251.4 lb

## 2013-11-03 DIAGNOSIS — N949 Unspecified condition associated with female genital organs and menstrual cycle: Secondary | ICD-10-CM

## 2013-11-03 DIAGNOSIS — N946 Dysmenorrhea, unspecified: Secondary | ICD-10-CM

## 2013-11-03 DIAGNOSIS — N970 Female infertility associated with anovulation: Secondary | ICD-10-CM

## 2013-11-03 DIAGNOSIS — N938 Other specified abnormal uterine and vaginal bleeding: Secondary | ICD-10-CM

## 2013-11-03 MED ORDER — CLOMIPHENE CITRATE 50 MG PO TABS: ORAL_TABLET | ORAL | Status: DC

## 2013-11-03 NOTE — Telephone Encounter (Signed)
Dr. Hyacinth MeekerMiller, is patient to start cycle then call us with cycle for Clomid order for days 5-9?

## 2013-11-03 NOTE — Telephone Encounter (Signed)
Patient wants to know when she should start Clomid? She was seen for a visit today but is unsure about the question.

## 2013-11-03 NOTE — Progress Notes (Signed)
28 y.o. G0 Married Caucasian Female here for preconceptual counseling.  Patient is unaccompanied today.  Trying for pregnancy 1 1/2 years without success.  Taking PNV.  Cycles not regular.  Last one 11/02/13 and prior was 08/14/13.  Cycles last 4-5 days.  Flow is moderate but very crampy.  Takes ibuprofen several times a day with cycles--usually 2 at a time.  Wonders if this is too much.   Gynecological History:   Menarche: 13.  Cycles never regular even on OCPs Cycle length:  Around 4-5  Length between cycles:  30-90 days GYN infectious disease history:  none  Current Birth Control method: none.  Stopped pills about 2 years ago.  PMH: No hx of DM, HTN, epilepsy, Heart Murmur, or thyroid problems  Medications and allergies reviewed  PSURGHX: None except wisdom tooth extraction  SOC Hx: Married.  Spouse never fathered children. Does really exercise. No smoking/drugs/ETOH [facility] Husband mixer at [employer].  He does smoke.   Partners Past Medical History: No PMHx No PSurg Hx Husband on no medications   Patients FMH:      Partners FMH: Multiple Births No   Multiple Births No Genetic Disorders No   Genetic Disorders No  Sickle cell      Sickle Cell  Hemophilia      Hemophila  Cystic Fibrosis     Cystic Fibrosis  Mental retardation     Mental retardation  Downs Syndrome     Downs Syndrome  Immunization Updates: Rubella Vaccine/ titer:  Yes, immun Chicken Pox / vaccination:  Yes, as a child. Toxoplasmosis exposure (no changing litter box) No Tdap: Yes, 10/01/06 Influenza vaccine who may get pregnant during the flu season:  Yes  Assessment:  Desires pregnancy DUB Dysmenorrhea Probable anovulation Possible PCOS  Plan: Counseling today included discussion of normal preganancy rates 80 % within 1 year, importance of PNVs (which patient is taking), timing of intercourse with ovulation dicussed  Labs:  FSH/LH, fasting insulin level, 17 OH P, testosterone  Plan  SHGM to evaluate uterus, endometrial cavity, and tubal patency  Semen analysis recommended.  Order and instructions provided.  Will start Clomid 50mg  day 5-9 this cycle.  Twin/triplet rate discussed.  Will need Day 23 progesterone on 2/24.  Appropriate anti-inflammatory medications discussed.  ~25 minutes spent with patient >50% of time was in face to face discussion of above.

## 2013-11-03 NOTE — Telephone Encounter (Signed)
Per OV note, LMP 11-02-13. Call to patient, VM has first and last name confirmation.  LM stating that Clomid begins on day 5 so that would be Friday,  call back for instructions.

## 2013-11-03 NOTE — Telephone Encounter (Signed)
Encounter closed.   Thank you.  

## 2013-11-04 LAB — BASIC METABOLIC PANEL
BUN: 7 mg/dL (ref 6–23)
CALCIUM: 9.1 mg/dL (ref 8.4–10.5)
CHLORIDE: 104 meq/L (ref 96–112)
CO2: 26 meq/L (ref 19–32)
Creat: 0.6 mg/dL (ref 0.50–1.10)
GLUCOSE: 71 mg/dL (ref 70–99)
POTASSIUM: 4.2 meq/L (ref 3.5–5.3)
SODIUM: 139 meq/L (ref 135–145)

## 2013-11-04 LAB — FSH/LH
FSH: 7 m[IU]/mL
LH: 6.3 m[IU]/mL

## 2013-11-04 LAB — INSULIN, FASTING: Insulin fasting, serum: 12 u[IU]/mL (ref 3–28)

## 2013-11-04 LAB — TESTOSTERONE: Testosterone: 51 ng/dL (ref 10–70)

## 2013-11-05 ENCOUNTER — Telehealth: Payer: Self-pay | Admitting: *Deleted

## 2013-11-05 DIAGNOSIS — N970 Female infertility associated with anovulation: Secondary | ICD-10-CM

## 2013-11-05 NOTE — Telephone Encounter (Signed)
Patient is returning a call to sabrina

## 2013-11-05 NOTE — Telephone Encounter (Signed)
[facility] (based on cycle starting 11-02-13). LMTCB.

## 2013-11-05 NOTE — Telephone Encounter (Signed)
Left message for patient to call back to go over insurance info and to [facility]

## 2013-11-05 NOTE — Telephone Encounter (Signed)
Patient returned call/ was advised of $35 copay as quoted by insurance company for SHGM/scheduled SHMG/ advised patient of cancellation policy and cancellation fee/ssf

## 2013-11-05 NOTE — Telephone Encounter (Signed)
Please co-sign  

## 2013-11-05 NOTE — Telephone Encounter (Signed)
Patient is returning sallys call °

## 2013-11-07 LAB — 17-HYDROXYPROGESTERONE: 17-OH-Progesterone, LC/MS/MS: 30 ng/dL

## 2013-11-10 ENCOUNTER — Other Ambulatory Visit: Payer: Self-pay | Admitting: Obstetrics & Gynecology

## 2013-11-10 ENCOUNTER — Ambulatory Visit (INDEPENDENT_AMBULATORY_CARE_PROVIDER_SITE_OTHER): Payer: BC Managed Care – PPO | Admitting: Obstetrics & Gynecology

## 2013-11-10 ENCOUNTER — Ambulatory Visit (INDEPENDENT_AMBULATORY_CARE_PROVIDER_SITE_OTHER): Payer: BC Managed Care – PPO

## 2013-11-10 VITALS — BP 122/82 | Ht 65.25 in | Wt 250.2 lb

## 2013-11-10 DIAGNOSIS — N970 Female infertility associated with anovulation: Secondary | ICD-10-CM

## 2013-11-10 DIAGNOSIS — N925 Other specified irregular menstruation: Secondary | ICD-10-CM

## 2013-11-10 DIAGNOSIS — N938 Other specified abnormal uterine and vaginal bleeding: Secondary | ICD-10-CM

## 2013-11-10 DIAGNOSIS — N949 Unspecified condition associated with female genital organs and menstrual cycle: Secondary | ICD-10-CM

## 2013-11-10 NOTE — Progress Notes (Signed)
28 y.o. Bary RichardG0 Marriedfemale here for a pelvic ultrasound with sonohystogram due to DUB and probable amenorrhea.  Pt desirous of pregnancy.  Had lab work done earlier this week.  Reviewed with patient.  Does not look like PCOS.  Started Clomid 50mg  days 5-9 this month.  Patient's last menstrual period was 11/02/2013.  Contraception: none  Technique:  Both transabdominal and transvaginal ultrasound examinations of the pelvis were performed. Transabdominal technique was performed for global imaging of the pelvis including uterus,  ovaries, adnexal regions, and pelvic cul-de-sac.  It was necessary to proceed with endovaginal exam following the abdominal ultrasound transabdominal exam to visualize the endometrium and adnexa.  Color and duplex Doppler ultrasound was utilized to evaluate blood flow to the ovaries.    FINDINGS: UTERUS: 6.1 x 3.8 x 2.9cm EMS: 3.187mm ADNEXA: left ovary 3.6 x 2.2 x 2.3cm Right ovary 4.4 x 2.6 x 2.4cm, dominant follicle is small CUL DE SAC: no free fluid  SHSG:  After obtaining appropriate verbal consent from patient, the cervix was visualized using a speculum, and prepped with betadine.  A tenaculum  was applied to the cervix.  Dilation of the cervix was not necessary. The catheter was passed into the uterus and sterile saline introduced but inadequate dilation of the cavity was seen.  Second attempt made with catheter containing sponge at the tip to act as a dam for the fluid.  Adequate cavity distension without any tubal spill noted.  Patient tolerated all procedure well.  All instruments removed.  Assessment:  DUB, anovulation without evidence of PCOS No evidence of spill from either tube--bilateral tubal blockage vs tubal spasm  Plan:  Will not start Glucophage. Continue clomid this month.  Based on u/s findings today, will probable need to increased Clomid dose if we continue with this. Plan progesterone on 2/24 to determine if medication needs to be increased. Will  either repeat SHGM component with pt taking Mobic/same class medication before procedure vs HSG done with interventional radiology Patient will encourage spouse to do semen analysis  ~25 minutes spent with patient >50% of time was in face to face discussion of above.

## 2013-11-15 ENCOUNTER — Encounter: Payer: Self-pay | Admitting: Obstetrics & Gynecology

## 2013-11-15 DIAGNOSIS — N938 Other specified abnormal uterine and vaginal bleeding: Secondary | ICD-10-CM | POA: Insufficient documentation

## 2013-11-23 ENCOUNTER — Encounter: Payer: Self-pay | Admitting: Obstetrics & Gynecology

## 2013-11-24 ENCOUNTER — Other Ambulatory Visit: Payer: BC Managed Care – PPO

## 2013-11-24 ENCOUNTER — Other Ambulatory Visit: Payer: Self-pay | Admitting: Obstetrics & Gynecology

## 2013-11-24 ENCOUNTER — Other Ambulatory Visit (INDEPENDENT_AMBULATORY_CARE_PROVIDER_SITE_OTHER): Payer: BC Managed Care – PPO

## 2013-11-24 DIAGNOSIS — N970 Female infertility associated with anovulation: Secondary | ICD-10-CM

## 2013-11-25 ENCOUNTER — Other Ambulatory Visit: Payer: Self-pay | Admitting: Obstetrics & Gynecology

## 2013-11-27 LAB — PROGESTERONE: Progesterone: 24.8 ng/mL

## 2013-12-07 ENCOUNTER — Telehealth: Payer: Self-pay | Admitting: Obstetrics & Gynecology

## 2013-12-07 ENCOUNTER — Other Ambulatory Visit: Payer: Self-pay | Admitting: *Deleted

## 2013-12-07 ENCOUNTER — Other Ambulatory Visit: Payer: Self-pay | Admitting: Orthopedic Surgery

## 2013-12-07 DIAGNOSIS — N971 Female infertility of tubal origin: Secondary | ICD-10-CM

## 2013-12-07 NOTE — Telephone Encounter (Signed)
Return call to patient.  See My Chart e-mails.  Patient LMP 12-02-13.  Now on cycle day 6.  Femara was not available yesterday, is it too late to start?  Advised I thought it was too late for this cycle but will confirm with MD.  PUS/SHGM scheduled for 12-10-13, cycle day 9 at 4pm.  Advised patient this is an add on appointment and need to confirm with MD for availability.    Needs medication called that was to take [facility] to [facility].  Please advise.

## 2013-12-07 NOTE — Telephone Encounter (Signed)
Too late for Femara this month.  I want her to take 2 Aleve about 12 hours before u/s and again about a hour before u/s.  Thanks.  Sending this to [place] Anaracy to call as Kennon RoundsSally will be out of office on Tuesday.

## 2013-12-07 NOTE — Telephone Encounter (Signed)
Spoke with pt and she clarified [facility] [address]. [employer] also inquiring about PUS Dr. Hyacinth MeekerMiller mentioned in her email thru MyChart. Advised that Kennon RoundsSally would be calling to schedule. Pt agreeable.

## 2013-12-07 NOTE — Telephone Encounter (Signed)
Patient calling re: MyChart message from Dr. Hyacinth MeekerMiller stating she would sent an RX for Femara to the pharmacy below. However, the pharmacy does not have a record of the RX. The patient requests this be called into the second pharmacy below at this point as it is closer to her work and she was supposed to start the RX yesterday.  [facility] [address]  [facility] [address]

## 2013-12-07 NOTE — Telephone Encounter (Signed)
LMTCB    Dr. Hyacinth MeekerMiller, I wasn't sure how many to dispense or how many refills you wanted for this Femara. [facility] [address].

## 2013-12-08 NOTE — Telephone Encounter (Signed)
Spoke with patient. Message from Dr. Hyacinth MeekerMiller given. She is scheduled for Sonohysterogram on 12/10/13 at 1600. Patient aware will receive call from [place] benefits.   Routing to provider for final review. Patient agreeable to disposition. Will close encounter  Routing to [place] for precert and link order.

## 2013-12-10 ENCOUNTER — Encounter: Payer: Self-pay | Admitting: Obstetrics & Gynecology

## 2013-12-10 ENCOUNTER — Ambulatory Visit (INDEPENDENT_AMBULATORY_CARE_PROVIDER_SITE_OTHER): Payer: BC Managed Care – PPO | Admitting: Obstetrics & Gynecology

## 2013-12-10 ENCOUNTER — Ambulatory Visit (INDEPENDENT_AMBULATORY_CARE_PROVIDER_SITE_OTHER): Payer: BC Managed Care – PPO

## 2013-12-10 VITALS — BP 106/78 | Ht 65.25 in | Wt 249.0 lb

## 2013-12-10 DIAGNOSIS — N970 Female infertility associated with anovulation: Secondary | ICD-10-CM

## 2013-12-10 DIAGNOSIS — N971 Female infertility of tubal origin: Secondary | ICD-10-CM

## 2013-12-10 MED ORDER — LETROZOLE 2.5 MG PO TABS: ORAL_TABLET | ORAL | Status: DC

## 2013-12-10 NOTE — Progress Notes (Signed)
28 y.o. Helen Yates Marriedfemale here for repeat SHGM due to possible tubal occlusion.  At last ultrasound, had difficulty getting cavity to distend and therefore could not see tubal patency.  Attempted to improve this several different ways without success.  Checked if pt's insurance covered HGS and it does not so she is back again to see if I can get enough distension to see if there is or is not tubal disease.   Pt did take Clomid last month with lots of cramping.  She states it stopped as soon as she was off the Clomid.  Will change to femara this month.  On PNV.     Indication:  Question tubal patency  Contraception: none  LMP:  12/02/13  SHSG:  After obtaining appropriate verbal consent from patient, the cervix was visualized using a speculum, and prepped with betadine.  A tenaculum  was applied to the cervix.  Dilation of the cervix was not necessary. SHGM catheter with bulb advanced through cervix.  Bulb inflated with 3cc's of air.  Bulb pulled back against cervix before instilling sterile saline.  The following findings: bilateral tubal patency was noted with spill and fluid in the cul de sac.    Bulb deflated and catheter removed.  All instruments removed.  Pt tolerated procedure well.  Assessment:  Anovulation Tubal patency documented today  Plan:  Pt will proceed with Femara next month.  She will call with onset of cycle.  No progesterone level will be needed.  Rx given for Femara.  All questions answered.  ~15 minutes spent with patient >50% of time was in face to face discussion of above.

## 2014-02-08 ENCOUNTER — Encounter: Payer: Self-pay | Admitting: Obstetrics & Gynecology

## 2014-02-19 ENCOUNTER — Ambulatory Visit (INDEPENDENT_AMBULATORY_CARE_PROVIDER_SITE_OTHER): Payer: BC Managed Care – PPO | Admitting: Obstetrics & Gynecology

## 2014-02-19 ENCOUNTER — Encounter: Payer: Self-pay | Admitting: Obstetrics & Gynecology

## 2014-02-19 VITALS — BP 128/78 | HR 60 | Resp 20 | Ht 65.25 in | Wt 248.6 lb

## 2014-02-19 DIAGNOSIS — N912 Amenorrhea, unspecified: Secondary | ICD-10-CM

## 2014-02-19 LAB — POCT URINE PREGNANCY: Preg Test, Ur: POSITIVE

## 2014-02-19 NOTE — Progress Notes (Deleted)
Subjective:     Patient ID: Helen Yates, female   DOB: 06-04-1986, 28 y.o.   MRN: [id number]  HPI   Review of Systems     Objective:   Physical Exam     Assessment:     ***    Plan:     ***

## 2014-02-19 NOTE — Progress Notes (Signed)
Patient ID: Kymbree Bas, female   DOB: 1985-12-11, 28 y.o.   MRN: [id number]  28 yo G1 MWF here with + UPT at home.  Pt reports she did five of these before calling the office.  No cats in the home.  CMV risk low as not around young children.   Has +UPT here today.   LMP 01/04/14.  EGA 6 5/7 weeks today.  ECD 10/10/14.  Having mild nausea.  No emesis.  Fatigue is really significant for her.  Having a lot of heart burn.  Had some breast tenderness early on but it is milder now.  Also having some dizziness.  D/W pt water consumption.  Is on a PNV.  Tdap 1/08.  Had chicken pox as a child.  Aware flu vaccine safe and recommended.   Fish/shellfish/mercury discuss.  Patient eats fish only occasionally.  Unpasteurized cheese/juices discussed.  Nitrites in foods disucssed.  Exercise and intercourse discussed.    Assessment:  Amenorrhea, +UPT, EGA 6 5/7  Plan:  Cont PNV Increase water Increase tums use Viability PUS  ~15 minutes spent with patient >50% of time was in face to face discussion of above.

## 2014-02-23 ENCOUNTER — Ambulatory Visit (INDEPENDENT_AMBULATORY_CARE_PROVIDER_SITE_OTHER): Payer: BC Managed Care – PPO | Admitting: Obstetrics & Gynecology

## 2014-02-23 ENCOUNTER — Ambulatory Visit (INDEPENDENT_AMBULATORY_CARE_PROVIDER_SITE_OTHER): Payer: BC Managed Care – PPO

## 2014-02-23 ENCOUNTER — Encounter: Payer: Self-pay | Admitting: Obstetrics & Gynecology

## 2014-02-23 ENCOUNTER — Other Ambulatory Visit: Payer: Self-pay | Admitting: Obstetrics & Gynecology

## 2014-02-23 VITALS — BP 120/82 | Ht 65.25 in | Wt 246.0 lb

## 2014-02-23 DIAGNOSIS — N912 Amenorrhea, unspecified: Secondary | ICD-10-CM

## 2014-02-23 LAB — US OB TRANSVAGINAL

## 2014-02-23 MED ORDER — PROMETHAZINE HCL 25 MG PO TABS: 25.0000 mg | ORAL_TABLET | Freq: Four times a day (QID) | ORAL | Status: DC | PRN

## 2014-02-23 NOTE — Progress Notes (Signed)
28 y.o.Marriedfemale here for a pelvic ultrasound for viability.  LMP 01/04/14.  EDC based on u/s 10/11/14.  Conceived on Femara after two months of Clomid.  Clomid caused her significant cramping and therefore was switched to the Femara.  Having signficant nausea.  Hasn't thrown up yet.  Also having a lot of constipation that she feels it due to not being able to really drink a lot of fluid like normal.  Large amounts of fluid, among other things, makes her feel so nauseated.  The worst for her is at night as the nausea prevents her from being able to sleep well, at times.  D/W options for treatment.  Will try phenergan.  Patient's last menstrual period was 01/04/2014.  FINDINGS: UTERUS:  With singleton IUD.  CRL 0.81cm.  C/W gestation at 6 5/7 weeks.  Normal gestational sac.  Normal yolk sac.  FCA at 121 BPM. ADNEXA:   Left ovary normal   Right ovary with corpus luteal cyst 1.5cm CUL DE SAC: no free fluid  Images reviewed with pt.  EGA by ultrasound is 6 5/7 and by LMP 7 1/7.  FCA normal.  Patient and I reviewed transfer of care at this times.  Practice options discussed.  Cystic fibrosis and Down's syndrome testing reviewed.  Pt wished well.  Assessment:  Viable singleton IUP Plan: [facility].  Pt will call when has appt so records can be sent. Phenergan rx to pharmacy.  ~20 minutes spent with patient >50% of time was in face to face discussion of above.

## 2014-02-24 ENCOUNTER — Encounter: Payer: Self-pay | Admitting: Obstetrics & Gynecology

## 2014-02-24 MED ORDER — PROMETHAZINE HCL 25 MG PO TABS: 25.0000 mg | ORAL_TABLET | Freq: Four times a day (QID) | ORAL | Status: DC | PRN

## 2014-02-24 NOTE — Telephone Encounter (Signed)
Spoke with patient and confirmed pharmacy. She would like [facility] [address] in [place]. Advised that rx was sent and ready for her at her [facility].  [facility]   She is agreeable.

## 2014-03-04 DIAGNOSIS — Z0289 Encounter for other administrative examinations: Secondary | ICD-10-CM

## 2014-03-05 LAB — OB RESULTS CONSOLE RUBELLA ANTIBODY, IGM: Rubella: IMMUNE

## 2014-03-05 LAB — OB RESULTS CONSOLE HIV ANTIBODY (ROUTINE TESTING): HIV: NONREACTIVE

## 2014-03-05 LAB — OB RESULTS CONSOLE HEPATITIS B SURFACE ANTIGEN: HEP B S AG: NEGATIVE

## 2014-03-05 LAB — OB RESULTS CONSOLE ABO/RH: RH Type: POSITIVE

## 2014-03-05 LAB — OB RESULTS CONSOLE RPR: RPR: NONREACTIVE

## 2014-03-05 LAB — OB RESULTS CONSOLE ANTIBODY SCREEN: Antibody Screen: NEGATIVE

## 2014-03-26 LAB — OB RESULTS CONSOLE GC/CHLAMYDIA
Chlamydia: NEGATIVE
Gonorrhea: NEGATIVE

## 2014-07-08 ENCOUNTER — Ambulatory Visit: Payer: BC Managed Care – PPO | Admitting: Certified Nurse Midwife

## 2014-07-08 ENCOUNTER — Telehealth: Payer: Self-pay | Admitting: Certified Nurse Midwife

## 2014-07-08 NOTE — Telephone Encounter (Signed)
Pt dnka appointment for aex today. Will cancel appointment and not charge because pt is pregnant.

## 2014-09-18 LAB — OB RESULTS CONSOLE GBS: GBS: NEGATIVE

## 2014-10-01 NOTE — L&D Delivery Note (Signed)
Delivery Note At 3:08 PM a viable and healthy female was delivered via Vaginal, Spontaneous Delivery (Presentation: Right Occiput Anterior).  APGAR: 8, 9; weight  pending.   Placenta status: Intact, Spontaneous.  Cord: 3 vessels with the following complications: None.  Cord pH: na  Anesthesia: Epidural  Episiotomy:  none Lacerations:  second Suture Repair: 2.0 3.0 vicryl rapide Est. Blood Loss (mL):  400  Mom to postpartum.  Baby to Couplet care / Skin to Skin.  Loray Akard J 10/14/2014, 3:31 PM

## 2014-10-11 ENCOUNTER — Encounter (HOSPITAL_COMMUNITY): Payer: Self-pay

## 2014-10-11 ENCOUNTER — Inpatient Hospital Stay (HOSPITAL_COMMUNITY)
Admission: AD | Admit: 2014-10-11 | Discharge: 2014-10-11 | Disposition: A | Discharge status: Home / Self Care | Payer: BC Managed Care – PPO | Source: Ambulatory Visit | Attending: Obstetrics | Admitting: Obstetrics

## 2014-10-11 DIAGNOSIS — Z3A40 40 weeks gestation of pregnancy: Secondary | ICD-10-CM | POA: Insufficient documentation

## 2014-10-11 DIAGNOSIS — O9989 Other specified diseases and conditions complicating pregnancy, childbirth and the puerperium: Secondary | ICD-10-CM | POA: Diagnosis present

## 2014-10-11 LAB — AMNISURE RUPTURE OF MEMBRANE (ROM) NOT AT ARMC: Amnisure ROM: NEGATIVE

## 2014-10-11 NOTE — MAU Note (Signed)
Pt states that she SROM at 2330. Pt states that baby is active and denies bleeding. Pt denies contractions and states she is having irregular tightnings.

## 2014-10-11 NOTE — MAU Note (Signed)
Pt may be discharged to home with follow up in office.

## 2014-10-11 NOTE — Discharge Instructions (Signed)

## 2014-10-11 NOTE — MAU Note (Signed)
Dr. Algie CofferFogelman would like amnisure performed.

## 2014-10-12 ENCOUNTER — Telehealth (HOSPITAL_COMMUNITY): Payer: Self-pay | Admitting: *Deleted

## 2014-10-12 NOTE — Telephone Encounter (Signed)
Preadmission screen  

## 2014-10-14 ENCOUNTER — Inpatient Hospital Stay (HOSPITAL_COMMUNITY): Payer: BC Managed Care – PPO | Admitting: Anesthesiology

## 2014-10-14 ENCOUNTER — Inpatient Hospital Stay (HOSPITAL_COMMUNITY)
Admission: AD | Admit: 2014-10-14 | Discharge: 2014-10-16 | DRG: 775 | Disposition: A | Discharge status: Home / Self Care | Payer: BC Managed Care – PPO | Source: Ambulatory Visit | Attending: Obstetrics | Admitting: Obstetrics

## 2014-10-14 ENCOUNTER — Encounter (HOSPITAL_COMMUNITY): Payer: Self-pay | Admitting: *Deleted

## 2014-10-14 DIAGNOSIS — D649 Anemia, unspecified: Secondary | ICD-10-CM | POA: Diagnosis present

## 2014-10-14 DIAGNOSIS — Z3A40 40 weeks gestation of pregnancy: Secondary | ICD-10-CM | POA: Diagnosis present

## 2014-10-14 DIAGNOSIS — Z6841 Body Mass Index (BMI) 40.0 and over, adult: Secondary | ICD-10-CM | POA: Diagnosis not present

## 2014-10-14 DIAGNOSIS — O2613 Low weight gain in pregnancy, third trimester: Secondary | ICD-10-CM | POA: Diagnosis present

## 2014-10-14 DIAGNOSIS — O99214 Obesity complicating childbirth: Secondary | ICD-10-CM | POA: Diagnosis present

## 2014-10-14 DIAGNOSIS — O9081 Anemia of the puerperium: Secondary | ICD-10-CM | POA: Diagnosis present

## 2014-10-14 DIAGNOSIS — IMO0001 Reserved for inherently not codable concepts without codable children: Secondary | ICD-10-CM

## 2014-10-14 HISTORY — DX: Female infertility, unspecified: N97.9

## 2014-10-14 LAB — CBC
HEMATOCRIT: 34 % — AB (ref 36.0–46.0)
HEMOGLOBIN: 11.7 g/dL — AB (ref 12.0–15.0)
MCH: 30.2 pg (ref 26.0–34.0)
MCHC: 34.4 g/dL (ref 30.0–36.0)
MCV: 87.9 fL (ref 78.0–100.0)
Platelets: 240 10*3/uL (ref 150–400)
RBC: 3.87 MIL/uL (ref 3.87–5.11)
RDW: 13.7 % (ref 11.5–15.5)
WBC: 10.7 10*3/uL — ABNORMAL HIGH (ref 4.0–10.5)

## 2014-10-14 LAB — TYPE AND SCREEN
ABO/RH(D): B POS
ANTIBODY SCREEN: NEGATIVE

## 2014-10-14 LAB — ABO/RH: ABO/RH(D): B POS

## 2014-10-14 MED ORDER — PHENYLEPHRINE 40 MCG/ML (10ML) SYRINGE FOR IV PUSH (FOR BLOOD PRESSURE SUPPORT)
80.0000 ug | PREFILLED_SYRINGE | INTRAVENOUS | Status: DC | PRN
  Filled 2014-10-14: qty 20
  Filled 2014-10-14: qty 2

## 2014-10-14 MED ORDER — ACETAMINOPHEN 325 MG PO TABS: 650.0000 mg | ORAL_TABLET | ORAL | Status: DC | PRN

## 2014-10-14 MED ORDER — EPHEDRINE 5 MG/ML INJ
10.0000 mg | INTRAVENOUS | Status: DC | PRN
  Filled 2014-10-14: qty 2

## 2014-10-14 MED ORDER — ZOLPIDEM TARTRATE 5 MG PO TABS: 5.0000 mg | ORAL_TABLET | Freq: Every evening | ORAL | Status: DC | PRN

## 2014-10-14 MED ORDER — IBUPROFEN 600 MG PO TABS
600.0000 mg | ORAL_TABLET | Freq: Four times a day (QID) | ORAL | Status: DC
  Administered 2014-10-15 – 2014-10-16 (×8): 600 mg via ORAL
  Filled 2014-10-14 (×8): qty 1

## 2014-10-14 MED ORDER — DIBUCAINE 1 % RE OINT: 1.0000 "application " | TOPICAL_OINTMENT | RECTAL | Status: DC | PRN

## 2014-10-14 MED ORDER — LIDOCAINE HCL (PF) 1 % IJ SOLN
INTRAMUSCULAR | Status: DC | PRN
  Administered 2014-10-14 (×2): 4 mL

## 2014-10-14 MED ORDER — LACTATED RINGERS IV SOLN
500.0000 mL | INTRAVENOUS | Status: DC | PRN
  Administered 2014-10-14: 1000 mL via INTRAVENOUS

## 2014-10-14 MED ORDER — LACTATED RINGERS IV SOLN
INTRAVENOUS | Status: DC
  Administered 2014-10-14: 11:00:00 via INTRAVENOUS

## 2014-10-14 MED ORDER — OXYCODONE-ACETAMINOPHEN 5-325 MG PO TABS
2.0000 | ORAL_TABLET | ORAL | Status: DC | PRN
  Administered 2014-10-15 – 2014-10-16 (×2): 2 via ORAL
  Filled 2014-10-14 (×2): qty 2

## 2014-10-14 MED ORDER — ONDANSETRON HCL 4 MG/2ML IJ SOLN: 4.0000 mg | Freq: Four times a day (QID) | INTRAMUSCULAR | Status: DC | PRN

## 2014-10-14 MED ORDER — METHYLERGONOVINE MALEATE 0.2 MG PO TABS: 0.2000 mg | ORAL_TABLET | ORAL | Status: DC | PRN

## 2014-10-14 MED ORDER — FENTANYL 2.5 MCG/ML BUPIVACAINE 1/10 % EPIDURAL INFUSION (WH - ANES)
14.0000 mL/h | INTRAMUSCULAR | Status: DC | PRN
  Administered 2014-10-14: 14 mL/h via EPIDURAL
  Filled 2014-10-14: qty 125

## 2014-10-14 MED ORDER — LIDOCAINE HCL (PF) 1 % IJ SOLN
30.0000 mL | INTRAMUSCULAR | Status: AC | PRN
  Administered 2014-10-14: 30 mL via SUBCUTANEOUS
  Filled 2014-10-14: qty 30

## 2014-10-14 MED ORDER — SENNOSIDES-DOCUSATE SODIUM 8.6-50 MG PO TABS
2.0000 | ORAL_TABLET | ORAL | Status: DC
  Administered 2014-10-15 – 2014-10-16 (×2): 2 via ORAL
  Filled 2014-10-14 (×2): qty 2

## 2014-10-14 MED ORDER — BENZOCAINE-MENTHOL 20-0.5 % EX AERO
1.0000 "application " | INHALATION_SPRAY | CUTANEOUS | Status: DC | PRN
  Administered 2014-10-14 – 2014-10-16 (×2): 1 via TOPICAL
  Filled 2014-10-14 (×2): qty 56

## 2014-10-14 MED ORDER — LANOLIN HYDROUS EX OINT: TOPICAL_OINTMENT | CUTANEOUS | Status: DC | PRN

## 2014-10-14 MED ORDER — FENTANYL 2.5 MCG/ML BUPIVACAINE 1/10 % EPIDURAL INFUSION (WH - ANES)
INTRAMUSCULAR | Status: DC | PRN
  Administered 2014-10-14: 14 mL/h via EPIDURAL

## 2014-10-14 MED ORDER — LACTATED RINGERS IV SOLN: 500.0000 mL | Freq: Once | INTRAVENOUS | Status: DC

## 2014-10-14 MED ORDER — CITRIC ACID-SODIUM CITRATE 334-500 MG/5ML PO SOLN
30.0000 mL | ORAL | Status: DC | PRN
  Administered 2014-10-14: 30 mL via ORAL
  Filled 2014-10-14: qty 15

## 2014-10-14 MED ORDER — ONDANSETRON HCL 4 MG/2ML IJ SOLN
4.0000 mg | INTRAMUSCULAR | Status: DC | PRN
Start: 2014-10-14 — End: 2014-10-16

## 2014-10-14 MED ORDER — PHENYLEPHRINE 40 MCG/ML (10ML) SYRINGE FOR IV PUSH (FOR BLOOD PRESSURE SUPPORT)
80.0000 ug | PREFILLED_SYRINGE | INTRAVENOUS | Status: DC | PRN
  Filled 2014-10-14: qty 2

## 2014-10-14 MED ORDER — DIPHENHYDRAMINE HCL 25 MG PO CAPS: 25.0000 mg | ORAL_CAPSULE | Freq: Four times a day (QID) | ORAL | Status: DC | PRN

## 2014-10-14 MED ORDER — DIPHENHYDRAMINE HCL 50 MG/ML IJ SOLN: 12.5000 mg | INTRAMUSCULAR | Status: DC | PRN

## 2014-10-14 MED ORDER — TETANUS-DIPHTH-ACELL PERTUSSIS 5-2.5-18.5 LF-MCG/0.5 IM SUSP: 0.5000 mL | Freq: Once | INTRAMUSCULAR | Status: DC

## 2014-10-14 MED ORDER — OXYTOCIN BOLUS FROM INFUSION: 500.0000 mL | INTRAVENOUS | Status: DC

## 2014-10-14 MED ORDER — METHYLERGONOVINE MALEATE 0.2 MG/ML IJ SOLN: 0.2000 mg | INTRAMUSCULAR | Status: DC | PRN

## 2014-10-14 MED ORDER — PRENATAL MULTIVITAMIN CH
1.0000 | ORAL_TABLET | Freq: Every day | ORAL | Status: DC
  Administered 2014-10-15 – 2014-10-16 (×2): 1 via ORAL
  Filled 2014-10-14 (×2): qty 1

## 2014-10-14 MED ORDER — OXYCODONE-ACETAMINOPHEN 5-325 MG PO TABS
1.0000 | ORAL_TABLET | ORAL | Status: DC | PRN
  Administered 2014-10-15 – 2014-10-16 (×4): 1 via ORAL
  Filled 2014-10-14 (×4): qty 1

## 2014-10-14 MED ORDER — OXYCODONE-ACETAMINOPHEN 5-325 MG PO TABS: 2.0000 | ORAL_TABLET | ORAL | Status: DC | PRN

## 2014-10-14 MED ORDER — SIMETHICONE 80 MG PO CHEW: 80.0000 mg | CHEWABLE_TABLET | ORAL | Status: DC | PRN

## 2014-10-14 MED ORDER — BUPIVACAINE HCL (PF) 0.25 % IJ SOLN
INTRAMUSCULAR | Status: DC | PRN
  Administered 2014-10-14 (×2): 5 mL

## 2014-10-14 MED ORDER — OXYCODONE-ACETAMINOPHEN 5-325 MG PO TABS: 1.0000 | ORAL_TABLET | ORAL | Status: DC | PRN

## 2014-10-14 MED ORDER — OXYTOCIN 40 UNITS IN LACTATED RINGERS INFUSION - SIMPLE MED
62.5000 mL/h | INTRAVENOUS | Status: DC
  Administered 2014-10-14: 999 mL/h via INTRAVENOUS
  Filled 2014-10-14: qty 1000

## 2014-10-14 MED ORDER — WITCH HAZEL-GLYCERIN EX PADS: 1.0000 "application " | MEDICATED_PAD | CUTANEOUS | Status: DC | PRN

## 2014-10-14 MED ORDER — FLEET ENEMA 7-19 GM/118ML RE ENEM: 1.0000 | ENEMA | RECTAL | Status: DC | PRN

## 2014-10-14 MED ORDER — ONDANSETRON HCL 4 MG PO TABS: 4.0000 mg | ORAL_TABLET | ORAL | Status: DC | PRN

## 2014-10-14 NOTE — MAU Note (Signed)
Contractions started at 0430, seem back to back now.  Small amt of blood initially. No leaking. Was 2cm when last checked.

## 2014-10-14 NOTE — Anesthesia Procedure Notes (Signed)
Epidural Patient location during procedure: OB Start time: 10/14/2014 10:42 AM End time: 10/14/2014 10:49 AM  Staffing Anesthesiologist: Felipe DroneJUDD, Jaycen Vercher JENNETTE Performed by: anesthesiologist   Preanesthetic Checklist Completed: patient identified, site marked, surgical consent, pre-op evaluation, timeout performed, IV checked, risks and benefits discussed and monitors and equipment checked  Epidural Patient position: sitting Prep: site prepped and draped and DuraPrep Patient monitoring: continuous pulse ox and blood pressure Approach: midline Location: L3-L4 Injection technique: LOR saline  Needle:  Needle type: Tuohy  Needle gauge: 17 G Needle length: 9 cm and 9 Needle insertion depth: 8 and 8 cm Catheter type: closed end flexible Catheter size: 19 Gauge Catheter at skin depth: 13 cm Test dose: negative  Assessment Events: blood not aspirated, injection not painful, no injection resistance, negative IV test and no paresthesia  Additional Notes Patient identified. Risks/Benefits/Options discussed with patient including but not limited to bleeding, infection, nerve damage, paralysis, failed block, incomplete pain control, headache, blood pressure changes, nausea, vomiting, reactions to medication both or allergic, itching and postpartum back pain. Confirmed with bedside nurse the patient's most recent platelet count. Confirmed with patient that they are not currently taking any anticoagulation, have any bleeding history or any family history of bleeding disorders. Patient expressed understanding and wished to proceed. All questions were answered. Sterile technique was used throughout the entire procedure. Please see nursing notes for vital signs. Test dose was given through epidural catheter and negative prior to continuing to dose epidural or start infusion. Warning signs of high block given to the patient including shortness of breath, tingling/numbness in hands, complete motor block,  or any concerning symptoms with instructions to call for help. Patient was given instructions on fall risk and not to get out of bed. All questions and concerns addressed with instructions to call with any issues or inadequate analgesia.

## 2014-10-14 NOTE — H&P (Signed)
Helen Yates is a 29 y.o. G1P0000 at 4153w3d presenting for active labor. Pt notes contractions started at 4 am. Good fetal movement, No vaginal bleeding, not leaking fluid  [facility] since 6 wks - Dated by LMP c/w 6 wk u/s - h/o infertility, Femara conception - N/V/ GERD, on Diclegis, anti-reflux meds all preg with poor appetite and low wt gain - fetal growth- 39 wks- 7'4, 64%, nl AFI, vtx - obese- 9# wt gain. abnl early DS with nl 3 hr then normal 28 wk DS   Prenatal Transfer Tool  Maternal Diabetes: No Genetic Screening: Normal Maternal Ultrasounds/Referrals: Normal Fetal Ultrasounds or other Referrals: None Maternal Substance Abuse: No Significant Maternal Medications: None Significant Maternal Lab Results: None     OB History    Gravida Para Term Preterm AB TAB SAB Ectopic Multiple Living   1 0 0 0 0 0 0 0 0 0      Past Medical History  Diagnosis Date  . Allergy   . Migraines   . Dysmenorrhea    Past Surgical History  Procedure Laterality Date  . Wisdom tooth extraction     Family History: family history includes Breast cancer in her paternal grandmother; Diabetes in her maternal grandfather and paternal grandmother; Fibromyalgia in her mother; Heart disease in her maternal grandfather; Hypertension in her father; Migraines in her father; Osteoporosis in her mother. Social History:  reports that she has never smoked. She does not have any smokeless tobacco history on file. She reports that she does not drink alcohol or use illicit drugs.   Meds: Protonix, Fiorcet, Magnesium, Diclegis  Review of Systems - Negative except discomfort of pregnancy     Last menstrual period 01/04/2014. Filed Vitals:   10/14/14 0902 10/14/14 0946 10/14/14 1011  BP: 144/97 137/88 148/89  Pulse: 73 67 68  Temp: 98.2 F (36.8 C)    TempSrc: Oral    Resp: 18    Height: 5' 4.5" (1.638 m)    Weight: 114.76 kg (253 lb)        Physical Exam:  No distress, uncomfortable with ctx Abd: gravid, NT LE: NT, no edema Cvx: 4/90 per RN   Prenatal labs: ABO, Rh: B/Positive/-- (06/05 0000) Antibody: Negative (06/05 0000) Rubella:  immune RPR: Nonreactive (06/05 0000)  HBsAg: Negative (06/05 0000)  HIV: Non-reactive (06/05 0000)  GBS: Negative (12/19 0000)  1 hr Glucola 109 Genetic screening nl quad Anatomy US normal   Assessment/Plan: 29 y.o. G1P0000 at  40'3 Active labor. Expectant management Plans epidural  Helen Yates A. 10/12/2014, 8:46 PM

## 2014-10-14 NOTE — Anesthesia Preprocedure Evaluation (Addendum)
Anesthesia Evaluation  Patient identified by MRN, date of birth, ID band Patient awake    Reviewed: Allergy & Precautions, Patient's Chart, lab work & pertinent test results  Airway Mallampati: III  TM Distance: >3 FB Neck ROM: Full    Dental no notable dental hx. (+) Teeth Intact   Pulmonary neg pulmonary ROS,  breath sounds clear to auscultation  Pulmonary exam normal       Cardiovascular negative cardio ROS  Rhythm:Regular Rate:Normal     Neuro/Psych  Headaches, negative psych ROS   GI/Hepatic Neg liver ROS, GERD-  Medicated and Controlled,  Endo/Other  Morbid obesity  Renal/GU negative Renal ROS  negative genitourinary   Musculoskeletal negative musculoskeletal ROS (+)   Abdominal (+) + obese,   Peds  Hematology  (+) anemia ,   Anesthesia Other Findings   Reproductive/Obstetrics (+) Pregnancy                             Anesthesia Physical Anesthesia Plan  ASA: III  Anesthesia Plan: Epidural   Post-op Pain Management:    Induction:   Airway Management Planned: Natural Airway  Additional Equipment:   Intra-op Plan:   Post-operative Plan:   Informed Consent: I have reviewed the patients History and Physical, chart, labs and discussed the procedure including the risks, benefits and alternatives for the proposed anesthesia with the patient or authorized representative who has indicated his/her understanding and acceptance.     Plan Discussed with: Anesthesiologist  Anesthesia Plan Comments:         Anesthesia Quick Evaluation

## 2014-10-15 LAB — RPR: RPR: NONREACTIVE

## 2014-10-15 LAB — CBC
HEMATOCRIT: 29.5 % — AB (ref 36.0–46.0)
Hemoglobin: 10.2 g/dL — ABNORMAL LOW (ref 12.0–15.0)
MCH: 30.4 pg (ref 26.0–34.0)
MCHC: 34.6 g/dL (ref 30.0–36.0)
MCV: 88.1 fL (ref 78.0–100.0)
PLATELETS: 216 10*3/uL (ref 150–400)
RBC: 3.35 MIL/uL — AB (ref 3.87–5.11)
RDW: 13.9 % (ref 11.5–15.5)
WBC: 12.2 10*3/uL — AB (ref 4.0–10.5)

## 2014-10-15 NOTE — Lactation Note (Signed)
This note was copied from the chart of Helen Magdalene PatriciaKristen Boehle. Lactation Consultation Note  Mom called out for latch assist.  Baby showing feeding cues and fussy.  Hand expression and reverse pressure done.  Assisted with positioning baby in football hold.  Attempted latch with good breast compression but baby fussy and would not latch.  24 mm nipple shield applied and baby remained fussy with latch.  Nipple shield prefilled with formula and baby latched well and nursed actively for 10 minutes.  Mom states she is comfortable with filling shield if needed.  Instructed to continue post pumping every 3 hours and give any expressed milk to baby.  Encouraged to call out for concerns/assist.  Patient Name: Helen Yates WGNFA'OToday's Date: 10/15/2014 Reason for consult: Follow-up assessment;Difficult latch   Maternal Data Has patient been taught Hand Expression?: Yes (Mom reports RN taught hand expression) Does the patient have breastfeeding experience prior to this delivery?: No  Feeding Feeding Type: Breast Fed Length of feed: 10 min  LATCH Score/Interventions Latch: Repeated attempts needed to sustain latch, nipple held in mouth throughout feeding, stimulation needed to elicit sucking reflex. (WITH 24 M NIPPLE SHIELD) Intervention(s): Adjust position;Assist with latch;Breast massage;Breast compression  Audible Swallowing: A few with stimulation Intervention(s): Hand expression;Alternate breast massage  Type of Nipple: Everted at rest and after stimulation  Comfort (Breast/Nipple): Soft / non-tender     Hold (Positioning): Assistance needed to correctly position infant at breast and maintain latch. Intervention(s): Breastfeeding basics reviewed;Support Pillows;Position options  LATCH Score: 7  Lactation Tools Discussed/Used Tools: Pump;Nipple Shields Nipple shield size: 24 Breast pump type: Double-Electric Breast Pump WIC Program: No   Consult Status Consult Status: Follow-up Date:  10/15/14 Follow-up type: In-patient    Huston FoleyMOULDEN, Mozel Burdett S 10/15/2014, 6:42 PM

## 2014-10-15 NOTE — Lactation Note (Signed)
This note was copied from the chart of Helen Yates. Lactation Consultation Note  Patient Name: Helen Yates Today's Date: 10/15/2014 Reason for consult: Initial assessment Mom reports RN started #24 nipple shield due to baby not sustaining latch. Mom reports baby did BF with last feeding when nipple shield was pre-loaded with formula using curved tipped syringe. Mom has DEBP set up and has pumped few times receiving few drops of colostrum. Baby asleep at this visit. Basic teaching reviewed with Mom and encouraged to BF with feeding ques. Advised baby should be at the breast 8-12 times in 24 hours. Advised Mom to look for colostrum in the nipple shield with breastfeeding. Lactation brochure left for review, advised of OP services and support group. Guidelines for supplementing with BF reviewed with Mom. Encouraged to call with next feeding for LC assist.   Maternal Data Has patient been taught Hand Expression?: Yes (Mom reports RN taught hand expression) Does the patient have breastfeeding experience prior to this delivery?: No  Feeding    LATCH Score/Interventions                      Lactation Tools Discussed/Used Tools: Pump;Nipple Shields Nipple shield size: 24 Breast pump type: Double-Electric Breast Pump WIC Program: No   Consult Status Consult Status: Follow-up Date: 10/15/14 Follow-up type: In-patient    Alfred LevinsGranger, Luchiano Viscomi Ann 10/15/2014, 4:08 PM

## 2014-10-15 NOTE — Anesthesia Postprocedure Evaluation (Signed)
Anesthesia Post Note  Patient: Helen Yates  Procedure(s) Performed: * No procedures listed *  Anesthesia type: Epidural  Patient location: Mother/Baby  Post pain: Pain level controlled  Post assessment: Post-op Vital signs reviewed  Last Vitals:  Filed Vitals:   10/15/14 0607  BP: 117/61  Pulse: 68  Temp: 36.6 C  Resp: 20    Post vital signs: Reviewed  Level of consciousness:alert  Complications: No apparent anesthesia complications

## 2014-10-15 NOTE — Progress Notes (Signed)
Patient ID: Helen Yates, female   DOB: 1985/11/23, 29 y.o.   MRN: [id number] PPD # 1 SVD  S:  Reports feeling sore, but well             Tolerating po/ No nausea or vomiting             Bleeding is moderate, but just finished nursing for 30 mins             Pain controlled with ibuprofen (OTC)             Up ad lib / ambulatory / voiding without difficulties    Newborn  Information for the patient's newborn:  [name], Girl [name] [[id number]][[id number]]  female  breast feeding   O:  A & O x 3, in no apparent distress              VS:  Filed Vitals:   10/14/14 1740 10/14/14 1902 10/14/14 2145 10/15/14 0607  BP: 127/74 121/71 121/65 117/61  Pulse: 75 67 67 68  Temp: 98.3 F (36.8 C) 98.2 F (36.8 C) 98.6 F (37 C) 97.8 F (36.6 C)  TempSrc: Oral Oral Oral Axillary  Resp: 18 18 18 20   Height:      Weight:      SpO2:    98%    LABS:  Recent Labs  10/14/14 0940 10/15/14 0516  WBC 10.7* 12.2*  HGB 11.7* 10.2*  HCT 34.0* 29.5*  PLT 240 216    Blood type: B POS (01/14 0940)  Rubella: Immune (06/05 0000)   I&O: I/O last 3 completed shifts: In: -  Out: 400 [Blood:400]             Lungs: Clear and unlabored  Heart: regular rate and rhythm / no murmurs  Abdomen: soft, non-tender, non-distended              Fundus: firm, non-tender, U-1  Perineum: 2nd degree repair healing well, mild edema - no ice pack in place  Lochia: minimal / moderate just after nursing  Extremities: no edema, no calf pain or tenderness, no Homans    A/P: PPD # 1  28 y.o., G1P1001   Principal Problem:   Postpartum care following vaginal delivery (1/14) Active Problems:   Active labor at term   Doing well - stable status  Routine post partum orders  Anticipate discharge tomorrow    [name], [name], M, MSN, CNM 10/15/2014, 9:24 AM

## 2014-10-16 MED ORDER — POLYSACCHARIDE IRON COMPLEX 150 MG PO CAPS
150.0000 mg | ORAL_CAPSULE | Freq: Every day | ORAL | Status: DC
  Administered 2014-10-16: 150 mg via ORAL
  Filled 2014-10-16: qty 1

## 2014-10-16 MED ORDER — IBUPROFEN 600 MG PO TABS: 600.0000 mg | ORAL_TABLET | Freq: Four times a day (QID) | ORAL | Status: DC

## 2014-10-16 MED ORDER — OXYCODONE-ACETAMINOPHEN 5-325 MG PO TABS: 1.0000 | ORAL_TABLET | ORAL | Status: DC | PRN

## 2014-10-16 NOTE — Discharge Summary (Signed)
Obstetric Discharge Summary Reason for Admission: onset of labor Prenatal Course: obesity, low weight gain, infertility Intrapartum Procedures: spontaneous vaginal delivery Postpartum Procedures: none Complications-Operative and Postpartum: 2nd degree perineal laceration HEMOGLOBIN  Date Value Ref Range Status  10/15/2014 10.2* 12.0 - 15.0 g/dL Final   HCT  Date Value Ref Range Status  10/15/2014 29.5* 36.0 - 46.0 % Final    Physical Exam:  General: alert and cooperative Lochia: appropriate Uterine Fundus: firm Incision: healing well, no significant drainage, no dehiscence, no significant erythema DVT Evaluation: No evidence of DVT seen on physical exam. Negative Homan's sign. No cords or calf tenderness. No significant calf/ankle edema.  Discharge Diagnoses: Term Pregnancy-delivered, Anemia  Discharge Information: Date: 10/16/2014 Activity: pelvic rest Diet: routine Medications: PNV, Ibuprofen, Iron and Percocet Condition: stable Instructions: refer to practice specific booklet Discharge to: home Follow-up Information    [facility]. Schedule an appointment as soon as possible for a visit in 6 weeks.   Specialty:  Obstetrics and Gynecology   Contact information:   [address] Severe[address] [place] [id number] [phone]       Newborn Data: Live born female on 10/14/14 Birth Weight: 8 lb 0.2 oz (3635 g) APGAR: 9, 9  Home with mother.  Zvi Duplantis, N 10/16/2014, 10:32 PM

## 2014-10-16 NOTE — Lactation Note (Signed)
This note was copied from the chart of Helen Magdalene PatriciaKristen Rowlette. Lactation Consultation Note: Follow up visit with mom. She has been giving bottles of formula through the night. Reports she pumped last evening and obtained about 3 cc's of Colostrum. Offered assist with latch but baby had formula last and is asleep in her arms. Encouraged to call for assist prn. Plans to get pump from insurance company. Discussed 2 week rental from us if desired. To page if wants pump before DC. No questions at present. To call prn  Patient Name: Helen Magdalene PatriciaKristen Autry WUJWJ'XToday's Date: 10/16/2014 Reason for consult: Follow-up assessment   Maternal Data Formula Feeding for Exclusion: No Does the patient have breastfeeding experience prior to this delivery?: No  Feeding Feeding Type: Bottle Fed - Formula Nipple Type: Slow - flow  LATCH Score/Interventions                      Lactation Tools Discussed/Used     Consult Status Consult Status: PRN    Pamelia HoitWeeks, Sian Joles D 10/16/2014, 9:40 AM

## 2014-10-16 NOTE — Progress Notes (Signed)
PPD #2- SVD  Subjective:   Reports feeling well Tolerating po/ No nausea or vomiting Bleeding is light Pain controlled with Motrin and Percocet Up ad lib / ambulatory / voiding without problems Newborn: breastfeeding    Objective:   VS: VS:  Filed Vitals:   10/14/14 2145 10/15/14 0607 10/15/14 1800 10/16/14 0658  BP: 121/65 117/61 131/78 126/73  Pulse: 67 68 69 73  Temp: 98.6 F (37 C) 97.8 F (36.6 C) 97.9 F (36.6 C) 98 F (36.7 C)  TempSrc: Oral Axillary Oral Oral  Resp: 18 20 18    Height:      Weight:      SpO2:  98%  99%    LABS:  Recent Labs  10/14/14 0940 10/15/14 0516  WBC 10.7* 12.2*  HGB 11.7* 10.2*  PLT 240 216   Blood type: --/--/B POS, B POS (01/14 0940) Rubella: Immune (06/05 0000)                I&O: Intake/Output      01/15 0701 - 01/16 0700 01/16 0701 - 01/17 0700   Blood     Total Output       Net              Physical Exam: Alert and oriented X3 Abdomen: soft, non-tender, non-distended  Fundus: firm, non-tender, U-2 Perineum: Well approximated, no significant erythema, edema, or drainage; healing well. Lochia: small Extremities: No edema, no calf pain or tenderness    Assessment: PPD #2  G1P1001/ S/P:spontaneous vaginal, 2nd degree laceration Mild anemia Doing well - stable for discharge home   Plan: Discharge home RX's:  Ibuprofen 600mg  po Q 6 hrs prn pain #30 Refill x 0 Percocet 5/325 1 to 2 po Q 4 hrs prn pain #30 Refill x 0 Routine pp visit in [facility] booklet given    Donette LarryBHAMBRI, Remedy Corporan, N MSN, CNM 10/16/2014, 12:27 PM

## 2014-10-18 ENCOUNTER — Inpatient Hospital Stay (HOSPITAL_COMMUNITY)
Admission: RE | Admit: 2014-10-18 | Discharge: 2014-10-18 | Disposition: A | Discharge status: Home / Self Care | Payer: BC Managed Care – PPO | Source: Ambulatory Visit | Attending: Obstetrics | Admitting: Obstetrics

## 2014-11-01 ENCOUNTER — Encounter: Payer: Self-pay | Admitting: Family Medicine

## 2014-12-09 ENCOUNTER — Ambulatory Visit (INDEPENDENT_AMBULATORY_CARE_PROVIDER_SITE_OTHER): Payer: BC Managed Care – PPO | Admitting: Physician Assistant

## 2014-12-09 ENCOUNTER — Encounter: Payer: Self-pay | Admitting: Physician Assistant

## 2014-12-09 VITALS — BP 122/78 | HR 60 | Temp 98.3°F | Resp 18 | Ht 64.5 in | Wt 231.0 lb

## 2014-12-09 DIAGNOSIS — L918 Other hypertrophic disorders of the skin: Secondary | ICD-10-CM | POA: Diagnosis not present

## 2014-12-09 DIAGNOSIS — L089 Local infection of the skin and subcutaneous tissue, unspecified: Secondary | ICD-10-CM

## 2014-12-09 NOTE — Progress Notes (Signed)
Patient ID: Helen Yates MRN: [id number], DOB: Apr 16, 1986, 29 y.o. Date of Encounter: @  Chief Complaint:  Chief Complaint  Patient presents with  . new pt est care    look at mole on chest    HPI: 29 y.o. year old white female  presents as a new patient to establish care.  I have actually seen here her in the office recently when she has been bringing her newborn child in for checkups.  She only has one concern today and that is an irritated lesion on her chest which she would like to be have removed. Says it has been there for years. It has gotten larger and more irritated.   Past Medical History  Diagnosis Date  . Allergy   . Migraines   . Dysmenorrhea   . Infertility, female      Home Meds: Outpatient Prescriptions Prior to Visit  Medication Sig Dispense Refill  . ibuprofen (ADVIL,MOTRIN) 600 MG tablet Take 1 tablet (600 mg total) by mouth every 6 (six) hours. 30 tablet 0  . oxyCODONE-acetaminophen (PERCOCET/ROXICET) 5-325 MG per tablet Take 1-2 tablets by mouth every 4 (four) hours as needed (for pain scale equal to or greater than 7). (Patient not taking: Reported on 12/09/2014) 20 tablet 0  . pantoprazole (PROTONIX) 40 MG tablet   3  . Prenatal Vit-Fe Sulfate-FA (PRENATAL VITAMIN PO) Take 1 tablet by mouth daily.     No facility-administered medications prior to visit.    Allergies:  Allergies  Allergen Reactions  . Depo-Provera [Medroxyprogesterone Acetate] Rash    History   Social History  . Marital Status: Married    Spouse Name: N/A  . Number of Children: N/A  . Years of Education: N/A   Occupational History  . Not on file.   Social History Main Topics  . Smoking status: Never Smoker   . Smokeless tobacco: Never Used  . Alcohol Use: No  . Drug Use: No  . Sexual Activity:    Partners: Male    Birth Control/ Protection: Condom   Other Topics Concern  . Not on file   Social History Narrative    Family History  Problem Relation  Age of Onset  . Fibromyalgia Mother   . Osteoporosis Mother   . Arthritis Mother   . Asthma Mother   . Depression Mother   . Miscarriages / [place] Mother   . Migraines Father   . Hypertension Father   . Arthritis Father   . Heart disease Maternal Grandfather   . Diabetes Maternal Grandfather   . Alcohol abuse Maternal Grandfather   . Arthritis Maternal Grandfather   . Hyperlipidemia Maternal Grandfather   . Breast cancer Paternal Grandmother     twice  . Diabetes Paternal Grandmother   . Cancer Paternal Grandmother   . Hyperlipidemia Paternal Grandmother   . Arthritis Maternal Grandmother   . Heart disease Maternal Grandmother   . Drug abuse Brother      Review of Systems:  See HPI for pertinent ROS. All other ROS negative.    Physical Exam: Blood pressure 122/78, pulse 60, temperature 98.3 F (36.8 C), temperature source Oral, resp. rate 18, height 5' 4.5" (1.638 m), weight 231 lb (104.781 kg), currently breastfeeding., Body mass index is 39.05 kg/(m^2). General: WNWD WF. Appears in no acute distress. Neck: Supple. No thyromegaly. No lymphadenopathy. Lungs: Clear bilaterally to auscultation without wheezes, rales, or rhonchi. Breathing is unlabored. Heart: RRR with S1 S2. No murmurs, rubs, or gallops.  Musculoskeletal:  Strength and tone normal for 29. Skin: On the center of her chest, between the bases of her breasts, there is skin lesion:  Approximately 1 cm in size, pedunculated, brown in color.  Neuro: Alert and oriented X 3. Moves all extremities spontaneously. Gait is normal. CNII-XII grossly in tact. Psych:  Responds to questions appropriately with a normal affect.     ASSESSMENT AND PLAN:  29 y.o. year old female with  1. Inflamed skin tag Discussed excision with patient and she is agreeable to proceed. She has never had any type of reaction to any numbing medications in the past.  Site cleansed with Betadine and alcohol pad. Site anaesthesized with epi  plus lidocaine. Shave biopsy performed. Lesion excised and sent to pathology. Minimal bleeding which was easily ceased with Drysol and pressure. Band aid applied. Discussed proper care of the site with patient and discussed indications for follow-up if sees any type of purulent drainage or erythema at the site.     Signed, [address]Mary Beth BensonDixon, [place], [facility] 12/09/2014 9:49 AM

## 2014-12-13 LAB — PATHOLOGY

## 2015-01-26 ENCOUNTER — Telehealth: Payer: Self-pay | Admitting: Physician Assistant

## 2015-01-26 NOTE — Telephone Encounter (Signed)
Pt wanted to know if safe to take Sudafed while breast feeding.  Information suggests that it is safe BUT can cause milk to dry up.  Pt made aware.

## 2015-01-26 NOTE — Telephone Encounter (Signed)
Patient has questions about taking a certain medication while she is nursing  [id number]

## 2015-03-30 ENCOUNTER — Encounter: Payer: Self-pay | Admitting: Physician Assistant

## 2015-03-30 ENCOUNTER — Ambulatory Visit (INDEPENDENT_AMBULATORY_CARE_PROVIDER_SITE_OTHER): Payer: BC Managed Care – PPO | Admitting: Physician Assistant

## 2015-03-30 VITALS — BP 126/72 | HR 70 | Temp 98.2°F | Resp 16 | Ht 66.0 in | Wt 232.0 lb

## 2015-03-30 DIAGNOSIS — M545 Low back pain, unspecified: Secondary | ICD-10-CM

## 2015-03-30 MED ORDER — CYCLOBENZAPRINE HCL 5 MG PO TABS: 5.0000 mg | ORAL_TABLET | Freq: Three times a day (TID) | ORAL | Status: DC | PRN

## 2015-03-30 MED ORDER — TRAMADOL HCL 50 MG PO TABS: ORAL_TABLET | ORAL | Status: DC

## 2015-03-30 NOTE — Progress Notes (Signed)
Patient ID: Helen Yates MRN: [id number], DOB: 21-Nov-1985, [age] y.o. Date of Encounter: 03/30/2015, 3:51 PM    Chief Complaint:  Chief Complaint  Patient presents with  . Back Pain     HPI: 29 y.o. year old white female says that when she saw her gynecologist for her 6 week postpartum visit her gynecologist prescribed her some muscle relaxer and pain pills to use for this low back pain. However says that she has used very little of either of those medications because they made her very drowsy and she was was difficult for her to wake up with the baby they made her so drowsy.  Says that she has some achy discomfort across both sides of her low back. Says that occasionally it will "catch" when she bends or moves a certain way. She has had no pain numbness tingling or weakness down either leg.  Says that she does not remember having any significant amount of back pain prior to pregnancy. That she felt a little during pregnancy. Says that she has mostly felt back pain after the pregnancy.  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 Does no heavy lifting or other activity. Desk work and otherwise just bending over to pick up the baby change the baby's diaper etc.    Home Meds:   Outpatient Prescriptions Prior to Visit  Medication Sig Dispense Refill  . ibuprofen (ADVIL,MOTRIN) 600 MG tablet Take 1 tablet (600 mg total) by mouth every 6 (six) hours. 30 tablet 0  . NORA-BE 0.35 MG tablet   6  . oxyCODONE-acetaminophen (PERCOCET/ROXICET) 5-325 MG per tablet Take 1-2 tablets by mouth every 4 (four) hours as needed (for pain scale equal to or greater than 7). 20 tablet 0  . cyclobenzaprine (FLEXERIL) 5 MG tablet 5 mg 3 (three) times daily as needed.   0  . pantoprazole (PROTONIX) 40 MG tablet   3  . Prenatal Vit-Fe Sulfate-FA (PRENATAL VITAMIN PO) Take 1 tablet by mouth daily.     No facility-administered medications prior to visit.    Allergies:  Allergies    Allergen Reactions  . Depo-Provera [Medroxyprogesterone Acetate] Rash      Review of Systems: See HPI for pertinent ROS. All other ROS negative.    Physical Exam: Blood pressure 126/72, pulse 70, temperature 98.2 F (36.8 C), temperature source Oral, resp. rate 16, height 5\' 6"  (1.676 m), weight 232 lb (105.235 kg), currently breastfeeding., Body mass index is 37.46 kg/(m^2). General:  Obese white female . Appears in no acute distress.  Neck: Supple. No thyromegaly. No lymphadenopathy. Lungs: Clear bilaterally to auscultation without wheezes, rales, or rhonchi. Breathing is unlabored. Heart: Regular rhythm. No murmurs, rubs, or gallops. Msk:  Strength and tone normal for age. Points to approx L5 level as area of pain. No pain reproduced with palpation. No pain with palpation of sciatic notches bilaterally. Straight leg raise and hip abduction are basically normal bilaterally. Extremities/Skin: Warm and dry.  Neuro: Alert and oriented X 3. Moves all extremities spontaneously. Gait is normal. CNII-XII grossly in tact. Psych:  Responds to questions appropriately with a normal affect.     ASSESSMENT AND PLAN:  29 y.o. year old female with  1. Bilateral low back pain without sciatica - DG Lumbar Spine Complete; Future - cyclobenzaprine (FLEXERIL) 5 MG tablet; Take 1 tablet (5 mg total) by mouth 3 (three) times daily as needed.  Dispense: 30 tablet; Refill: 2 - traMADol (ULTRAM) 50 MG tablet; Take 1 -  2 every 8 hours as needed for pain  Dispense: 60 tablet; Refill: 0 Will obtain x-ray to rule out pathology. She says that the baby is sleeping a little better at night now so maybe she can go back to trying the Flexeril at night. Will try the tramadol instead of the oxycodone--hopefully this will give her some relief in pain but not cause as much drowsiness and "loopy feeling" Discussed that a lot of people have back pain in general especially if they are sitting at a desk all day and not  stretching out those muscles. So discussed that with pregnancy there weight all in the abdomen putting pressure on the back. So discussed that during pregnancy abdominal muscles are not working so not giving support to the back. Discussed need to strengthen muscles of the abdomen and back. Also recommended heat and discuss specific stretches to do. Will follow-up with her and we get results of x-ray.  [address] [place], [place], [facility] 03/30/2015 3:51 PM

## 2015-05-02 ENCOUNTER — Ambulatory Visit (INDEPENDENT_AMBULATORY_CARE_PROVIDER_SITE_OTHER): Payer: BC Managed Care – PPO | Admitting: Family Medicine

## 2015-05-02 ENCOUNTER — Ambulatory Visit (INDEPENDENT_AMBULATORY_CARE_PROVIDER_SITE_OTHER): Payer: BC Managed Care – PPO

## 2015-05-02 VITALS — BP 102/72 | HR 87 | Temp 98.7°F | Resp 16 | Ht 65.0 in | Wt 229.6 lb

## 2015-05-02 DIAGNOSIS — M546 Pain in thoracic spine: Secondary | ICD-10-CM | POA: Diagnosis not present

## 2015-05-02 DIAGNOSIS — R1013 Epigastric pain: Secondary | ICD-10-CM | POA: Diagnosis not present

## 2015-05-02 DIAGNOSIS — R7989 Other specified abnormal findings of blood chemistry: Secondary | ICD-10-CM

## 2015-05-02 DIAGNOSIS — K8020 Calculus of gallbladder without cholecystitis without obstruction: Secondary | ICD-10-CM

## 2015-05-02 DIAGNOSIS — R112 Nausea with vomiting, unspecified: Secondary | ICD-10-CM

## 2015-05-02 DIAGNOSIS — R945 Abnormal results of liver function studies: Secondary | ICD-10-CM

## 2015-05-02 LAB — POCT URINALYSIS DIPSTICK
Bilirubin, UA: NEGATIVE
Glucose, UA: NEGATIVE
KETONES UA: NEGATIVE
Leukocytes, UA: NEGATIVE
Nitrite, UA: NEGATIVE
PROTEIN UA: NEGATIVE
RBC UA: NEGATIVE
Spec Grav, UA: 1.02
Urobilinogen, UA: 1
pH, UA: 6

## 2015-05-02 LAB — POCT UA - MICROSCOPIC ONLY
Casts, Ur, LPF, POC: NEGATIVE
Crystals, Ur, HPF, POC: NEGATIVE
Yeast, UA: NEGATIVE

## 2015-05-02 LAB — POCT CBC
Granulocyte percent: 55.7 %G (ref 37–80)
HCT, POC: 45.2 % (ref 37.7–47.9)
HEMOGLOBIN: 15.1 g/dL (ref 12.2–16.2)
LYMPH, POC: 2.4 (ref 0.6–3.4)
MCH: 29.4 pg (ref 27–31.2)
MCHC: 33.4 g/dL (ref 31.8–35.4)
MCV: 88.1 fL (ref 80–97)
MID (cbc): 0.6 (ref 0–0.9)
MPV: 8.2 fL (ref 0–99.8)
POC GRANULOCYTE: 3.7 (ref 2–6.9)
POC LYMPH %: 35.9 % (ref 10–50)
POC MID %: 8.4 %M (ref 0–12)
Platelet Count, POC: 328 10*3/uL (ref 142–424)
RBC: 5.13 M/uL (ref 4.04–5.48)
RDW, POC: 14.1 %
WBC: 6.7 10*3/uL (ref 4.6–10.2)

## 2015-05-02 LAB — POCT URINE PREGNANCY: PREG TEST UR: NEGATIVE

## 2015-05-02 MED ORDER — ONDANSETRON 4 MG PO TBDP: 4.0000 mg | ORAL_TABLET | Freq: Three times a day (TID) | ORAL | Status: DC | PRN

## 2015-05-02 NOTE — Progress Notes (Addendum)
Subjective:  This chart was scribed for Helen Staggers, MD by Helen Yates, medical scribe at [facility].The patient was seen in exam room 11 and the patient's care was started at 7:37 PM.   Patient ID: Helen Yates, female    DOB: [birth year], 29 y.o.   MRN: [id number] Chief Complaint  Patient presents with  . Back Pain    lower back extending to abdomin on going for months but worst over last two days  . Emesis    once last night   HPI HPI Comments: Helen Yates is a 29 y.o. female who presents to [facility] complaining of lower back pain that radiates to her abdomen. This has been ongoing for about 6 months but worse over the past two days. She had one episode of emesis las night. Last seen June 29 th by Allayne Butcher. PA-C at [facility]. Seen recently for post-partum visit given muscle relaxer and pain pills which she only took a few due to side effects. Encouraged to try the flexeril and prescribed tramadol. As well as home exercises to strengthen back and abdomen.  Today, she says the pain is becoming more frequent. Initially pain would come in the middle of the night lasting only a couple hours. Pain began a month after her delivery. The past two week she has had he pain Tuesday, Saturday and last night. The pain last night was very severe causing her to vomit, she also complained of night sweats and chills. Tried taking a hot bath, muscle relaxer, and pain medication for little relief. Eating does not reproduce the pain. She has less pain today. Stopped breast feeding at five months, taking Arna Medici. Menstrual periods have not started back. No fever, diarrhea, blood in stool or vomit, urinary symptoms.   Patient Active Problem List   Diagnosis Date Noted  . Active labor at term 10/14/2014  . Postpartum care following vaginal delivery (1/14) 10/14/2014  . DUB (dysfunctional uterine bleeding) 11/15/2013  . Vitamin D deficiency  05/27/2012  . Amenorrhea, secondary 05/27/2012  . Migraine with aura 03/16/2012   Past Medical History  Diagnosis Date  . Allergy   . Migraines   . Dysmenorrhea   . Infertility, female    Past Surgical History  Procedure Laterality Date  . Wisdom tooth extraction     Allergies  Allergen Reactions  . Depo-Provera [Medroxyprogesterone Acetate] Rash   Prior to Admission medications   Medication Sig Start Date End Date Taking? Authorizing Provider  cyclobenzaprine (FLEXERIL) 5 MG tablet Take 1 tablet (5 mg total) by mouth 3 (three) times daily as needed. 03/30/15  Yes Dorena Bodo, PA-C  NORA-BE 0.35 MG tablet  11/29/14  Yes Historical Provider, MD  oxyCODONE-acetaminophen (PERCOCET/ROXICET) 5-325 MG per tablet Take 1-2 tablets by mouth every 4 (four) hours as needed (for pain scale equal to or greater than 7). 10/16/14  Yes Lawernce Pitts, CNM  traMADol (ULTRAM) 50 MG tablet Take 1 - 2 every 8 hours as needed for pain 03/30/15  Yes Dorena Bodo, PA-C  ibuprofen (ADVIL,MOTRIN) 600 MG tablet Take 1 tablet (600 mg total) by mouth every 6 (six) hours. Patient not taking: Reported on 05/02/2015 10/16/14   Lawernce Pitts, CNM   History   Social History  . Marital Status: Married    Spouse Name: N/A  . Number of Children: N/A  . Years of Education: N/A   Occupational History  . Not  on file.   Social History Main Topics  . Smoking status: Never Smoker   . Smokeless tobacco: Never Used  . Alcohol Use: No  . Drug Use: No  . Sexual Activity:    Partners: Male    Birth Control/ Protection: Condom   Other Topics Concern  . Not on file   Social History Narrative   Review of Systems  Constitutional: Positive for chills and diaphoresis. Negative for fever.  Gastrointestinal: Positive for nausea, vomiting and abdominal pain. Negative for diarrhea and blood in stool.  Musculoskeletal: Positive for back pain.      Objective:  BP 102/72 mmHg  Pulse 87  Temp(Src) 98.7 F (37.1  C) (Oral)  Resp 16  Ht 5\' 5"  (1.651 m)  Wt 229 lb 9.6 oz (104.146 kg)  BMI 38.21 kg/m2  SpO2 99% Physical Exam  Constitutional: She is oriented to person, place, and time. She appears well-developed and well-nourished. No distress.  HENT:  Head: Normocephalic and atraumatic.  Eyes: Pupils are equal, round, and reactive to light.  Neck: Normal range of motion.  Cardiovascular: Normal rate, regular rhythm and normal heart sounds.  Exam reveals no gallop and no friction rub.   No murmur heard. Pulmonary/Chest: Effort normal and breath sounds normal. No respiratory distress.  Abdominal: Bowel sounds are normal. There is no rebound, no guarding, no tenderness at McBurney's point and negative Murphy's sign.  Minimal epigastric discomfort.  Musculoskeletal: Normal range of motion.  Nontender, Full ROM which does not reproduce pain.  Neurological: She is alert and oriented to person, place, and time.  Skin: Skin is warm and dry. No rash noted.  Psychiatric: She has a normal mood and affect. Her behavior is normal.  Nursing note and vitals reviewed.  [facility] reading (PRIMARY) by  Dr. Neva Seat: Thoracic spine: No acute bony findings.  Results for orders placed or performed in visit on 05/02/15  POCT urinalysis dipstick  Result Value Ref Range   Color, UA amber    Clarity, UA cloudy    Glucose, UA neg    Bilirubin, UA neg    Ketones, UA neg    Spec Grav, UA 1.020    Blood, UA neg    pH, UA 6.0    Protein, UA neg    Urobilinogen, UA 1.0    Nitrite, UA neg    Leukocytes, UA Negative Negative  POCT urine pregnancy  Result Value Ref Range   Preg Test, Ur Negative Negative  POCT UA - Microscopic Only  Result Value Ref Range   WBC, Ur, HPF, POC 2-5    RBC, urine, microscopic 0-1    Bacteria, U Microscopic trace    Mucus, UA trace    Epithelial cells, urine per micros 2-4    Crystals, Ur, HPF, POC neg    Casts, Ur, LPF, POC neg    Yeast, UA neg   POCT CBC  Result Value Ref Range    WBC 6.7 4.6 - 10.2 K/uL   Lymph, poc 2.4 0.6 - 3.4   POC LYMPH PERCENT 35.9 10 - 50 %L   MID (cbc) 0.6 0 - 0.9   POC MID % 8.4 0 - 12 %M   POC Granulocyte 3.7 2 - 6.9   Granulocyte percent 55.7 37 - 80 %G   RBC 5.13 4.04 - 5.48 M/uL   Hemoglobin 15.1 12.2 - 16.2 g/dL   HCT, POC 53.6 64.4 - 47.9 %   MCV 88.1 80 - 97 fL   MCH,  POC 29.4 27 - 31.2 pg   MCHC 33.4 31.8 - 35.4 g/dL   RDW, POC 16.1 %   Platelet Count, POC 328 142 - 424 K/uL   MPV 8.2 0 - 99.8 fL    Assessment and plan: Racquelle Hyser is a 29 y.o. female Thoracic back pain, unspecified back pain laterality - Plan: DG Thoracic Spine 2 View, US Abdomen Complete  Abdominal pain, epigastric - Plan: COMPLETE METABOLIC PANEL WITH GFR, POCT urinalysis dipstick, POCT urine pregnancy, POCT UA - Microscopic Only, POCT CBC, US Abdomen Complete  Non-intractable vomiting with nausea, vomiting of unspecified type - Plan: ondansetron (ZOFRAN ODT) 4 MG disintegrating tablet, US Abdomen Complete  Possible thoracic back pain or radiculopathy, with radiation across the abdomen. However with associated emesis and nausea 9 prior, differential  includes cholelithiasis or functional gallbladder disorder. Overall reassuring CBC and UA in office.   -Denies any urinary symptoms, but if these were to start, return to clinic for repeat UA and possible culture.  -Check CMP, and plan on ultrasound of abdomen the next day or 2 to look at gallbladder  -Zofran prescription given, she has tramadol as needed for pain.  -RTC/ER precautions discussed. Meds ordered this encounter  Medications  . ondansetron (ZOFRAN ODT) 4 MG disintegrating tablet    Sig: Take 1 tablet (4 mg total) by mouth every 8 (eight) hours as needed for nausea or vomiting.    Dispense:  10 tablet    Refill:  0   Patient Instructions  Your x-ray appears okay tonight. Blood count overall normal range, and urine testing overall appears okay. There were a few white blood cells on the urine  that are occasionally associated with urinary tract infection, but based on your symptoms tonight I do not think this is the case. If you have any burning or frequency in urination or any change in her urinary symptoms, return for repeat testing and possible urine culture.  I will arrange an ultrasound of your abdomen to look at gallbladder and the next day or 2. We also have some liver tests and other blood tests pending and will let you know once we have those results.  Okay to continue Ultram if needed for pain, if the nausea or vomiting returns I did prescribe some Zofran in the meantime. If your pain returns and does not improve within an hour or two, would recommend he be evaluated in the emergency room.   We can determine next step once we receive your ultrasound report.         I personally performed the services described in this documentation, which was scribed in my presence. The recorded information has been reviewed and considered, and addended by me as needed.   05/04/15 addendum. elevated lfts and ultrasound noted. Plan on surgical referral and lab only visit to recheck lfts' in next 2 days as some improvement in sx's, but still suspect gallstone source of pain few few days ago. ER precautions if worse prior to surgery eval.

## 2015-05-02 NOTE — Patient Instructions (Addendum)
Your x-ray appears okay tonight. Blood count overall normal range, and urine testing overall appears okay. There were a few white blood cells on the urine that are occasionally associated with urinary tract infection, but based on your symptoms tonight I do not think this is the case. If you have any burning or frequency in urination or any change in her urinary symptoms, return for repeat testing and possible urine culture.  I will arrange an ultrasound of your abdomen to look at gallbladder and the next day or 2. We also have some liver tests and other blood tests pending and will let you know once we have those results.  Okay to continue Ultram if needed for pain, if the nausea or vomiting returns I did prescribe some Zofran in the meantime. If your pain returns and does not improve within an hour or two, would recommend he be evaluated in the emergency room.   We can determine next step once we receive your ultrasound report.

## 2015-05-03 LAB — COMPLETE METABOLIC PANEL WITH GFR
ALT: 217 U/L — ABNORMAL HIGH (ref 6–29)
AST: 140 U/L — ABNORMAL HIGH (ref 10–30)
Albumin: 4.5 g/dL (ref 3.6–5.1)
Alkaline Phosphatase: 109 U/L (ref 33–115)
BUN: 9 mg/dL (ref 7–25)
CO2: 25 mmol/L (ref 20–31)
Calcium: 9.3 mg/dL (ref 8.6–10.2)
Chloride: 103 mmol/L (ref 98–110)
Creat: 0.69 mg/dL (ref 0.50–1.10)
GFR, Est African American: >89 mL/min (ref >=60)
GFR, Est Non African American: >89 mL/min (ref >=60)
Glucose, Bld: 94 mg/dL (ref 65–99)
Potassium: 4.3 mmol/L (ref 3.5–5.3)
SODIUM: 137 mmol/L (ref 135–146)
TOTAL PROTEIN: 7.5 g/dL (ref 6.1–8.1)
Total Bilirubin: 0.5 mg/dL (ref 0.2–1.2)

## 2015-05-04 ENCOUNTER — Ambulatory Visit
Admission: RE | Admit: 2015-05-04 | Discharge: 2015-05-04 | Disposition: A | Discharge status: Home / Self Care | Payer: BC Managed Care – PPO | Source: Ambulatory Visit | Attending: Family Medicine | Admitting: Family Medicine

## 2015-05-04 DIAGNOSIS — R112 Nausea with vomiting, unspecified: Secondary | ICD-10-CM

## 2015-05-04 DIAGNOSIS — M546 Pain in thoracic spine: Secondary | ICD-10-CM

## 2015-05-04 DIAGNOSIS — R1013 Epigastric pain: Secondary | ICD-10-CM

## 2015-05-04 NOTE — Addendum Note (Signed)
Addended by: [name] R on: 05/04/2015 06:55 PM   Modules accepted: Orders

## 2015-05-06 ENCOUNTER — Other Ambulatory Visit (INDEPENDENT_AMBULATORY_CARE_PROVIDER_SITE_OTHER): Payer: BC Managed Care – PPO | Admitting: Family Medicine

## 2015-05-06 DIAGNOSIS — R945 Abnormal results of liver function studies: Principal | ICD-10-CM

## 2015-05-06 DIAGNOSIS — R7989 Other specified abnormal findings of blood chemistry: Secondary | ICD-10-CM

## 2015-05-06 LAB — HEPATIC FUNCTION PANEL
ALBUMIN: 4 g/dL (ref 3.6–5.1)
ALK PHOS: 83 U/L (ref 33–115)
ALT: 50 U/L — ABNORMAL HIGH (ref 6–29)
AST: 16 U/L (ref 10–30)
BILIRUBIN INDIRECT: 0.4 mg/dL (ref 0.2–1.2)
Bilirubin, Direct: 0.2 mg/dL (ref <=0.2)
TOTAL PROTEIN: 6.4 g/dL (ref 6.1–8.1)
Total Bilirubin: 0.6 mg/dL (ref 0.2–1.2)

## 2015-05-30 ENCOUNTER — Encounter (HOSPITAL_COMMUNITY): Payer: Self-pay | Admitting: *Deleted

## 2015-05-30 ENCOUNTER — Ambulatory Visit: Payer: Self-pay | Admitting: General Surgery

## 2015-05-30 MED ORDER — CHLORHEXIDINE GLUCONATE 4 % EX LIQD: 1.0000 "application " | Freq: Once | CUTANEOUS | Status: DC

## 2015-05-30 MED ORDER — CEFAZOLIN SODIUM-DEXTROSE 2-3 GM-% IV SOLR
2.0000 g | INTRAVENOUS | Status: AC
  Administered 2015-05-31: 2 g via INTRAVENOUS
  Filled 2015-05-30: qty 50

## 2015-05-30 NOTE — Progress Notes (Signed)
No pre-op orders in EPIC, called Dr. Jacinto Halim office to request orders. Spoke with Toniann Fail.

## 2015-05-31 ENCOUNTER — Ambulatory Visit (HOSPITAL_COMMUNITY): Payer: BC Managed Care – PPO | Admitting: Anesthesiology

## 2015-05-31 ENCOUNTER — Encounter (HOSPITAL_COMMUNITY): Payer: Self-pay | Admitting: Anesthesiology

## 2015-05-31 ENCOUNTER — Ambulatory Visit (HOSPITAL_COMMUNITY)
Admission: RE | Admit: 2015-05-31 | Discharge: 2015-05-31 | Disposition: A | Discharge status: Home / Self Care | Payer: BC Managed Care – PPO | Source: Ambulatory Visit | Attending: General Surgery | Admitting: General Surgery

## 2015-05-31 ENCOUNTER — Encounter (HOSPITAL_COMMUNITY)
Admission: RE | Disposition: A | Discharge status: Home / Self Care | Payer: Self-pay | Source: Ambulatory Visit | Attending: General Surgery

## 2015-05-31 DIAGNOSIS — K8010 Calculus of gallbladder with chronic cholecystitis without obstruction: Secondary | ICD-10-CM | POA: Diagnosis not present

## 2015-05-31 DIAGNOSIS — K219 Gastro-esophageal reflux disease without esophagitis: Secondary | ICD-10-CM | POA: Diagnosis not present

## 2015-05-31 DIAGNOSIS — K8020 Calculus of gallbladder without cholecystitis without obstruction: Secondary | ICD-10-CM | POA: Diagnosis present

## 2015-05-31 DIAGNOSIS — Z419 Encounter for procedure for purposes other than remedying health state, unspecified: Secondary | ICD-10-CM

## 2015-05-31 HISTORY — DX: Anemia, unspecified: D64.9

## 2015-05-31 HISTORY — DX: Gastro-esophageal reflux disease without esophagitis: K21.9

## 2015-05-31 HISTORY — PX: CHOLECYSTECTOMY: SHX55

## 2015-05-31 LAB — CBC
HEMATOCRIT: 41.1 % (ref 36.0–46.0)
Hemoglobin: 14.3 g/dL (ref 12.0–15.0)
MCH: 30.6 pg (ref 26.0–34.0)
MCHC: 34.8 g/dL (ref 30.0–36.0)
MCV: 88 fL (ref 78.0–100.0)
Platelets: 266 10*3/uL (ref 150–400)
RBC: 4.67 MIL/uL (ref 3.87–5.11)
RDW: 12.9 % (ref 11.5–15.5)
WBC: 5.2 10*3/uL (ref 4.0–10.5)

## 2015-05-31 LAB — PREGNANCY, URINE: Preg Test, Ur: NEGATIVE

## 2015-05-31 LAB — HCG, SERUM, QUALITATIVE: Preg, Serum: NEGATIVE

## 2015-05-31 LAB — MAGNESIUM: Magnesium: 1.8 mg/dL (ref 1.7–2.4)

## 2015-05-31 SURGERY — LAPAROSCOPIC CHOLECYSTECTOMY
Anesthesia: General | Site: Abdomen

## 2015-05-31 MED ORDER — NEOSTIGMINE METHYLSULFATE 10 MG/10ML IV SOLN
INTRAVENOUS | Status: DC | PRN
  Administered 2015-05-31: 3 mg via INTRAVENOUS

## 2015-05-31 MED ORDER — DIPHENHYDRAMINE HCL 50 MG/ML IJ SOLN
INTRAMUSCULAR | Status: AC
  Filled 2015-05-31: qty 1

## 2015-05-31 MED ORDER — MIDAZOLAM HCL 2 MG/2ML IJ SOLN
INTRAMUSCULAR | Status: AC
  Filled 2015-05-31: qty 4

## 2015-05-31 MED ORDER — HYDROMORPHONE HCL 1 MG/ML IJ SOLN
INTRAMUSCULAR | Status: AC
  Filled 2015-05-31: qty 1

## 2015-05-31 MED ORDER — LIDOCAINE HCL (CARDIAC) 20 MG/ML IV SOLN
INTRAVENOUS | Status: AC
  Filled 2015-05-31: qty 5

## 2015-05-31 MED ORDER — ROCURONIUM BROMIDE 100 MG/10ML IV SOLN
INTRAVENOUS | Status: DC | PRN
  Administered 2015-05-31: 30 mg via INTRAVENOUS

## 2015-05-31 MED ORDER — 0.9 % SODIUM CHLORIDE (POUR BTL) OPTIME
TOPICAL | Status: DC | PRN
  Administered 2015-05-31: 1000 mL

## 2015-05-31 MED ORDER — LACTATED RINGERS IV SOLN: INTRAVENOUS | Status: DC

## 2015-05-31 MED ORDER — PROMETHAZINE HCL 25 MG/ML IJ SOLN
INTRAMUSCULAR | Status: AC
  Filled 2015-05-31: qty 1

## 2015-05-31 MED ORDER — BUPIVACAINE HCL (PF) 0.25 % IJ SOLN
INTRAMUSCULAR | Status: AC
  Filled 2015-05-31: qty 30

## 2015-05-31 MED ORDER — DEXAMETHASONE SODIUM PHOSPHATE 4 MG/ML IJ SOLN
INTRAMUSCULAR | Status: AC
  Filled 2015-05-31: qty 2

## 2015-05-31 MED ORDER — FENTANYL CITRATE (PF) 250 MCG/5ML IJ SOLN
INTRAMUSCULAR | Status: AC
  Filled 2015-05-31: qty 5

## 2015-05-31 MED ORDER — MEPERIDINE HCL 25 MG/ML IJ SOLN: 6.2500 mg | INTRAMUSCULAR | Status: DC | PRN

## 2015-05-31 MED ORDER — DEXAMETHASONE SODIUM PHOSPHATE 4 MG/ML IJ SOLN
INTRAMUSCULAR | Status: DC | PRN
  Administered 2015-05-31: 8 mg via INTRAVENOUS

## 2015-05-31 MED ORDER — PROPOFOL 10 MG/ML IV BOLUS
INTRAVENOUS | Status: DC | PRN
  Administered 2015-05-31: 150 mg via INTRAVENOUS

## 2015-05-31 MED ORDER — ONDANSETRON HCL 4 MG/2ML IJ SOLN
INTRAMUSCULAR | Status: DC | PRN
  Administered 2015-05-31: 4 mg via INTRAVENOUS

## 2015-05-31 MED ORDER — LACTATED RINGERS IV SOLN
INTRAVENOUS | Status: DC
  Administered 2015-05-31 (×3): via INTRAVENOUS

## 2015-05-31 MED ORDER — LIDOCAINE HCL (CARDIAC) 20 MG/ML IV SOLN
INTRAVENOUS | Status: DC | PRN
  Administered 2015-05-31: 100 mg via INTRAVENOUS

## 2015-05-31 MED ORDER — SODIUM CHLORIDE 0.9 % IR SOLN
Status: DC | PRN
  Administered 2015-05-31: 1000 mL

## 2015-05-31 MED ORDER — OXYCODONE-ACETAMINOPHEN 5-325 MG PO TABS: 1.0000 | ORAL_TABLET | ORAL | Status: DC | PRN

## 2015-05-31 MED ORDER — PROMETHAZINE HCL 25 MG/ML IJ SOLN
6.2500 mg | INTRAMUSCULAR | Status: DC | PRN
Start: 2015-05-31 — End: 2015-05-31
  Administered 2015-05-31: 6.25 mg via INTRAVENOUS

## 2015-05-31 MED ORDER — FENTANYL CITRATE (PF) 100 MCG/2ML IJ SOLN
INTRAMUSCULAR | Status: DC | PRN
  Administered 2015-05-31 (×5): 50 ug via INTRAVENOUS

## 2015-05-31 MED ORDER — ONDANSETRON HCL 4 MG/2ML IJ SOLN
INTRAMUSCULAR | Status: AC
  Filled 2015-05-31: qty 2

## 2015-05-31 MED ORDER — MIDAZOLAM HCL 5 MG/5ML IJ SOLN
INTRAMUSCULAR | Status: DC | PRN
  Administered 2015-05-31: 2 mg via INTRAVENOUS

## 2015-05-31 MED ORDER — PROPOFOL 10 MG/ML IV BOLUS
INTRAVENOUS | Status: AC
  Filled 2015-05-31: qty 20

## 2015-05-31 MED ORDER — GLYCOPYRROLATE 0.2 MG/ML IJ SOLN
INTRAMUSCULAR | Status: DC | PRN
Start: 2015-05-31 — End: 2015-05-31
  Administered 2015-05-31: 0.4 mg via INTRAVENOUS

## 2015-05-31 MED ORDER — HYDROMORPHONE HCL 1 MG/ML IJ SOLN
0.2500 mg | INTRAMUSCULAR | Status: DC | PRN
  Administered 2015-05-31 (×4): 0.5 mg via INTRAVENOUS

## 2015-05-31 MED ORDER — BUPIVACAINE HCL 0.25 % IJ SOLN
INTRAMUSCULAR | Status: DC | PRN
  Administered 2015-05-31: 10 mL

## 2015-05-31 MED ORDER — DIPHENHYDRAMINE HCL 50 MG/ML IJ SOLN
6.2500 mg | Freq: Once | INTRAMUSCULAR | Status: AC
  Administered 2015-05-31: 6.125 mg via INTRAVENOUS

## 2015-05-31 MED ORDER — ROCURONIUM BROMIDE 50 MG/5ML IV SOLN
INTRAVENOUS | Status: AC
  Filled 2015-05-31: qty 1

## 2015-05-31 SURGICAL SUPPLY — 43 items
APL SKNCLS STERI-STRIP NONHPOA (GAUZE/BANDAGES/DRESSINGS) ×1
BAG SPEC RTRVL LRG 6X4 10 (ENDOMECHANICALS)
BENZOIN TINCTURE PRP APPL 2/3 (GAUZE/BANDAGES/DRESSINGS) ×3 IMPLANT
CANISTER SUCTION 2500CC (MISCELLANEOUS) ×3 IMPLANT
CHLORAPREP W/TINT 26ML (MISCELLANEOUS) ×3 IMPLANT
CLIP LIGATING HEMO O LOK GREEN (MISCELLANEOUS) ×3 IMPLANT
CLOSURE STERI-STRIP 1/2X4 (GAUZE/BANDAGES/DRESSINGS) ×1
CLSR STERI-STRIP ANTIMIC 1/2X4 (GAUZE/BANDAGES/DRESSINGS) ×1 IMPLANT
COVER SURGICAL LIGHT HANDLE (MISCELLANEOUS) ×3 IMPLANT
COVER TRANSDUCER ULTRASND (DRAPES) ×3 IMPLANT
DEVICE TROCAR PUNCTURE CLOSURE (ENDOMECHANICALS) ×3 IMPLANT
ELECT REM PT RETURN 9FT ADLT (ELECTROSURGICAL) ×3
ELECTRODE REM PT RTRN 9FT ADLT (ELECTROSURGICAL) ×1 IMPLANT
GAUZE SPONGE 2X2 8PLY STRL LF (GAUZE/BANDAGES/DRESSINGS) ×1 IMPLANT
GLOVE BIO SURGEON STRL SZ7.5 (GLOVE) ×3 IMPLANT
GLOVE BIOGEL PI IND STRL 6.5 (GLOVE) IMPLANT
GLOVE BIOGEL PI IND STRL 7.0 (GLOVE) IMPLANT
GLOVE BIOGEL PI INDICATOR 6.5 (GLOVE) ×2
GLOVE BIOGEL PI INDICATOR 7.0 (GLOVE) ×2
GOWN STRL REUS W/ TWL LRG LVL3 (GOWN DISPOSABLE) ×2 IMPLANT
GOWN STRL REUS W/ TWL XL LVL3 (GOWN DISPOSABLE) ×1 IMPLANT
GOWN STRL REUS W/TWL LRG LVL3 (GOWN DISPOSABLE) ×6
GOWN STRL REUS W/TWL XL LVL3 (GOWN DISPOSABLE) ×3
KIT BASIN OR (CUSTOM PROCEDURE TRAY) ×3 IMPLANT
KIT ROOM TURNOVER OR (KITS) ×3 IMPLANT
NDL INSUFFLATION 14GA 120MM (NEEDLE) ×1 IMPLANT
NEEDLE INSUFFLATION 14GA 120MM (NEEDLE) ×3 IMPLANT
NS IRRIG 1000ML POUR BTL (IV SOLUTION) ×3 IMPLANT
PAD ARMBOARD 7.5X6 YLW CONV (MISCELLANEOUS) ×6 IMPLANT
POUCH SPECIMEN RETRIEVAL 10MM (ENDOMECHANICALS) IMPLANT
SCISSORS LAP 5X35 DISP (ENDOMECHANICALS) ×3 IMPLANT
SET IRRIG TUBING LAPAROSCOPIC (IRRIGATION / IRRIGATOR) ×3 IMPLANT
SLEEVE ENDOPATH XCEL 5M (ENDOMECHANICALS) ×3 IMPLANT
SPECIMEN JAR SMALL (MISCELLANEOUS) ×3 IMPLANT
SPONGE GAUZE 2X2 STER 10/PKG (GAUZE/BANDAGES/DRESSINGS) ×2
SUT MNCRL AB 3-0 PS2 18 (SUTURE) ×3 IMPLANT
TAPE CLOTH SOFT 2X10 (GAUZE/BANDAGES/DRESSINGS) ×2 IMPLANT
TOWEL OR 17X24 6PK STRL BLUE (TOWEL DISPOSABLE) ×3 IMPLANT
TOWEL OR 17X26 10 PK STRL BLUE (TOWEL DISPOSABLE) ×3 IMPLANT
TRAY LAPAROSCOPIC MC (CUSTOM PROCEDURE TRAY) ×3 IMPLANT
TROCAR XCEL NON-BLD 11X100MML (ENDOMECHANICALS) ×3 IMPLANT
TROCAR XCEL NON-BLD 5MMX100MML (ENDOMECHANICALS) ×3 IMPLANT
TUBING INSUFFLATION (TUBING) ×3 IMPLANT

## 2015-05-31 NOTE — Anesthesia Preprocedure Evaluation (Addendum)
Anesthesia Evaluation  Patient identified by MRN, date of birth, ID band Patient awake    Reviewed: Allergy & Precautions, NPO status , Patient's Chart, lab work & pertinent test results  Airway Mallampati: II  TM Distance: >3 FB Neck ROM: Full    Dental  (+) Teeth Intact   Pulmonary  breath sounds clear to auscultation        Cardiovascular negative cardio ROS  Rhythm:Regular Rate:Normal     Neuro/Psych  Headaches, negative psych ROS   GI/Hepatic Neg liver ROS, GERD-  ,  Endo/Other  negative endocrine ROS  Renal/GU negative Renal ROS  negative genitourinary   Musculoskeletal negative musculoskeletal ROS (+)   Abdominal   Peds negative pediatric ROS (+)  Hematology negative hematology ROS (+)   Anesthesia Other Findings   Reproductive/Obstetrics negative OB ROS                            Anesthesia Physical Anesthesia Plan  ASA: III  Anesthesia Plan: General   Post-op Pain Management:    Induction: Intravenous  Airway Management Planned: Oral ETT  Additional Equipment:   Intra-op Plan:   Post-operative Plan: Extubation in OR  Informed Consent: I have reviewed the patients History and Physical, chart, labs and discussed the procedure including the risks, benefits and alternatives for the proposed anesthesia with the patient or authorized representative who has indicated his/her understanding and acceptance.   Dental advisory given  Plan Discussed with: CRNA  Anesthesia Plan Comments:         Anesthesia Quick Evaluation

## 2015-05-31 NOTE — Op Note (Signed)
05/31/2015  12:24 PM  PATIENT:  Helen Yates  29 y.o. female  PRE-OPERATIVE DIAGNOSIS:  cholelithiasis  POST-OPERATIVE DIAGNOSIS:  cholelithiasis  PROCEDURE:  Procedure(s): LAPAROSCOPIC CHOLECYSTECTOMY (N/A)  SURGEON:  Surgeon(s) and Role:    * Axel Filler, MD - Primary  ANESTHESIA:   local and general  EBL: <5cc  Total I/O In: 1000 [I.V.:1000] Out: -   BLOOD ADMINISTERED:none  DRAINS: none   LOCAL MEDICATIONS USED:  BUPIVICAINE   SPECIMEN:  Source of Specimen:  gallbladder  DISPOSITION OF SPECIMEN:  PATHOLOGY  COUNTS:  YES  TOURNIQUET:  * No tourniquets in log *  DICTATION: .Dragon Dictation The patient was taken to the operating and placed in the supine position with bilateral SCDs in place. The patient was prepped and draped in the usual sterile fashion. A time out was called and all facts were verified. A pneumoperitoneum was obtained via A Veress needle technique to a pressure of 14mm of mercury.  A 5mm trochar was then placed in the right upper quadrant under visualization, and there were no injuries to any abdominal organs. A 11 mm port was then placed in the umbilical region after infiltrating with local anesthesia under direct visualization. A second and third epigastric port and right lower quadrant port placement under direct visualization, respectively. The gallbladder was identified and retracted, the peritoneum was then sharply dissected from the gallbladder and this dissection was carried down to Calot's triangle. The gallbladder was identified and stripped away circumferentially and seen going into the gallbladder 360, the critical angle was obtained.  2 clips were placed proximally one distally and the cystic duct transected. The cystic artery was identified and 2 clips placed proximally and one distally and transected. We then proceeded to remove the gallbladder off the hepatic fossa with Bovie cautery. A retrieval bag was then placed in the abdomen  and gallbladder placed in the bag. The hepatic fossa was then reexamined and hemostasis was achieved with Bovie cautery and was excellent at the end of the case. The subhepatic fossa and perihepatic fossa was then irrigated until the effluent was clear. The 11 mm trocar fascia was reapproximated with the Endo Close #1 Vicryl x1. The pneumoperitoneum was evacuated and all trochars removed under direct visulalization. The skin was then closed with 4-0 Monocryl and the skin dressed with Steri-Strips, gauze, and tape. The patient was awaken from general anesthesia and taken to the recovery room in stable condition.   PLAN OF CARE: Discharge to home after PACU  PATIENT DISPOSITION:  PACU - hemodynamically stable.   Delay start of Pharmacological VTE agent (>24hrs) due to surgical blood loss or risk of bleeding: not applicable

## 2015-05-31 NOTE — Discharge Instructions (Signed)
[facility] ______[facility] °LAPAROSCOPIC SURGERY: POST OP INSTRUCTIONS °Always review your discharge instruction sheet given to you by the facility where your surgery was performed. °IF YOU HAVE DISABILITY OR FAMILY LEAVE FORMS, YOU MUST BRING THEM TO THE OFFICE FOR PROCESSING.   °DO NOT GIVE THEM TO YOUR DOCTOR. ° °1. A prescription for pain medication may be given to you upon discharge.  Take your pain medication as prescribed, if needed.  If narcotic pain medicine is not needed, then you may take acetaminophen (Tylenol) or ibuprofen (Advil) as needed. °2. Take your usually prescribed medications unless otherwise directed. °3. If you need a refill on your pain medication, please contact your pharmacy.  They will contact our office to request authorization. Prescriptions will not be filled after 5pm or on week-ends. °4. You should follow a light diet the first few days after arrival home, such as soup and crackers, etc.  Be sure to include lots of fluids daily. °5. Most patients will experience some swelling and bruising in the area of the incisions.  Ice packs will help.  Swelling and bruising can take several days to resolve.  °6. It is common to experience some constipation if taking pain medication after surgery.  Increasing fluid intake and taking a stool softener (such as Colace) will usually help or prevent this problem from occurring.  A mild laxative (Milk of Magnesia or Miralax) should be taken according to package instructions if there are no bowel movements after 48 hours. °7. Unless discharge instructions indicate otherwise, you may remove your bandages 24-48 hours after surgery, and you may shower at that time.  You may have steri-strips (small skin tapes) in place directly over the incision.  These strips should be left on the skin for 7-10 days.  If your surgeon used skin glue on the incision, you may shower in 24 hours.  The glue will flake off over the next 2-3 weeks.  Any sutures or  staples will be removed at the office during your follow-up visit. °8. ACTIVITIES:  You may resume regular (light) daily activities beginning the next day--such as daily self-care, walking, climbing stairs--gradually increasing activities as tolerated.  You may have sexual intercourse when it is comfortable.  Refrain from any heavy lifting or straining until approved by your doctor. °a. You may drive when you are no longer taking prescription pain medication, you can comfortably wear a seatbelt, and you can safely maneuver your car and apply brakes. °b. RETURN TO WORK:  __________________________________________________________ °9. You should see your doctor in the office for a follow-up appointment approximately 2-3 weeks after your surgery.  Make sure that you call for this appointment within a day or two after you arrive home to insure a convenient appointment time. °10. OTHER INSTRUCTIONS: __________________________________________________________________________________________________________________________ __________________________________________________________________________________________________________________________ °WHEN TO CALL YOUR DOCTOR: °1. Fever over 101.0 °2. Inability to urinate °3. Continued bleeding from incision. °4. Increased pain, redness, or drainage from the incision. °5. Increasing abdominal pain ° °The clinic staff is available to answer your questions during regular business hours.  Please don’t hesitate to call and ask to speak to one of the nurses for clinical concerns.  If you have a medical emergency, go to the nearest emergency room or call 911.  A surgeon from [facility] is always on call at the hospital. °[address], [address], [place], [place]  [address] ? [address] [address], [place], [place]   [address] °[phone] ? [phone] ? FAX [phone] °Web site:   [email or web address] °

## 2015-05-31 NOTE — Transfer of Care (Signed)
Immediate Anesthesia Transfer of Care Note  Patient: Helen Yates  Procedure(s) Performed: Procedure(s): LAPAROSCOPIC CHOLECYSTECTOMY (N/A)  Patient Location: PACU  Anesthesia Type:General  Level of Consciousness: awake, alert , oriented and patient cooperative  Airway & Oxygen Therapy: Patient Spontanous Breathing and Patient connected to nasal cannula oxygen  Post-op Assessment: Report given to RN and Post -op Vital signs reviewed and stable  Post vital signs: Reviewed and stable  Last Vitals:  Filed Vitals:   05/31/15 1038  BP: 124/81  Pulse: 63  Temp: 36.8 C  Resp: 20    Complications: No apparent anesthesia complications

## 2015-05-31 NOTE — H&P (Signed)
History of Present Illness Helen Filler MD; 05/23/2015 10:15 AM) Patient words: gallbladder.  The patient is a 29 year old female who presents for evaluation of gall stones. PATIENT IS A 55-YEAR-OLD FEMALE WHO IS REFERRED BY [name] FOR EVALUATION OF SYMPTOMATIC CHOLELITHIASIS. pATIENT STATES SHE'S HAD SEVERAL month history dating back to March of right upper quadrant pain. Patient states that she noticed that she's had some pain with hive higher fatty foods. She is been avoiding these foods over the last several weeks and has had symptomatic release. Patient underwent ultrasound revealed gallstones. Patient had elevated transaminases. T bili and alkaline phosphatase were normal.   Other Problems Helen Yates, CMA; 05/23/2015 9:53 AM) Back Pain Cholelithiasis Gastroesophageal Reflux Disease Migraine Headache  Past Surgical History Helen Yates, CMA; 05/23/2015 9:53 AM) Oral Surgery  Diagnostic Studies History Helen Yates, [place]; 05/23/2015 9:53 AM) Colonoscopy never Mammogram never Pap Smear 1-5 years ago  Allergies Helen Yates, CMA; 05/23/2015 9:53 AM) Depo-Provera *ANTINEOPLASTICS AND ADJUNCTIVE THERAPIES* Rash.  Medication History Helen Yates, CMA; 05/23/2015 9:53 AM) TraMADol HCl (50MG  Tablet, Oral) Active. Ondansetron (4MG  Tablet Disperse, Oral) Active. Medications Reconciled  Social History Helen Yates, [place]; 05/23/2015 9:53 AM) Caffeine use Carbonated beverages, Coffee, Tea. No alcohol use No drug use Tobacco use Never smoker.  Family History Helen Yates, [place]; 05/23/2015 9:53 AM) Alcohol Abuse Brother. Breast Cancer Family Members In General. Diabetes Mellitus Family Members In General. Hypertension Father.  Pregnancy / Birth History Helen Yates, CMA; 05/23/2015 9:53 AM) Age at menarche 13 years. Contraceptive History Oral contraceptives. Gravida 1 Irregular periods Maternal age 58-30 Para 1  Review of Systems Helen Yates  CMA; 05/23/2015 9:53 AM) General Present- Appetite Loss and Fatigue. Not Present- Chills, Fever, Night Sweats, Weight Gain and Weight Loss. Skin Not Present- Change in Wart/Mole, Dryness, Hives, Jaundice, New Lesions, Non-Healing Wounds, Rash and Ulcer. HEENT Present- Seasonal Allergies and Wears glasses/contact lenses. Not Present- Earache, Hearing Loss, Hoarseness, Nose Bleed, Oral Ulcers, Ringing in the Ears, Sinus Pain, Sore Throat, Visual Disturbances and Yellow Eyes. Respiratory Not Present- Bloody sputum, Chronic Cough, Difficulty Breathing, Snoring and Wheezing. Breast Not Present- Breast Mass, Breast Pain, Nipple Discharge and Skin Changes. Cardiovascular Not Present- Chest Pain, Difficulty Breathing Lying Down, Leg Cramps, Palpitations, Rapid Heart Rate, Shortness of Breath and Swelling of Extremities. Gastrointestinal Present- Abdominal Pain, Bloating and Nausea. Not Present- Bloody Stool, Change in Bowel Habits, Chronic diarrhea, Constipation, Difficulty Swallowing, Excessive gas, Gets full quickly at meals, Hemorrhoids, Indigestion, Rectal Pain and Vomiting. Female Genitourinary Not Present- Frequency, Nocturia, Painful Urination, Pelvic Pain and Urgency. Musculoskeletal Present- Back Pain. Not Present- Joint Pain, Joint Stiffness, Muscle Pain, Muscle Weakness and Swelling of Extremities. Neurological Present- Headaches. Not Present- Decreased Memory, Fainting, Numbness, Seizures, Tingling, Tremor, Trouble walking and Weakness. Psychiatric Not Present- Anxiety, Bipolar, Change in Sleep Pattern, Depression, Fearful and Frequent crying. Endocrine Not Present- Cold Intolerance, Excessive Hunger, Hair Changes, Heat Intolerance, Hot flashes and New Diabetes. Hematology Not Present- Easy Bruising, Excessive bleeding, Gland problems, HIV and Persistent Infections.   Vitals Helen Yates CMA; 05/23/2015 9:54 AM) 05/23/2015 9:53 AM Weight: 225 lb Height: 66in Body Surface Area: 2.18 m Body  Mass Index: 36.32 kg/m Temp.: 97.27F(Oral)  Pulse: 73 (Regular)  BP: 126/74 (Sitting, Left Arm, Standard)    Physical Exam Helen Filler MD; 05/23/2015 10:14 AM) General Mental Status-Alert. General Appearance-Consistent with stated age. Hydration-Well hydrated. Voice-Normal.  Head and Neck Head-normocephalic, atraumatic with no lesions or palpable masses.  Chest and Lung Exam Chest and lung  exam reveals -quiet, even and easy respiratory effort with no use of accessory muscles and on auscultation, normal breath sounds, no adventitious sounds and normal vocal resonance. Inspection Chest Wall - Normal. Back - normal.  Cardiovascular Cardiovascular examination reveals -on palpation PMI is normal in location and amplitude, no palpable S3 or S4. Normal cardiac borders., normal heart sounds, regular rate and rhythm with no murmurs, carotid auscultation reveals no bruits and normal pedal pulses bilaterally.  Abdomen Inspection Normal Exam - No Hernias. Skin - Scar - no surgical scars. Palpation/Percussion Normal exam - Soft, Non Tender, No Rebound tenderness, No Rigidity (guarding) and No hepatosplenomegaly. Auscultation Normal exam - Bowel sounds normal.  Neurologic Neurologic evaluation reveals -alert and oriented x 3 with no impairment of recent or remote memory. Mental Status-Normal.  Musculoskeletal Normal Exam - Left-Upper Extremity Strength Normal and Lower Extremity Strength Normal. Normal Exam - Right-Upper Extremity Strength Normal, Lower Extremity Weakness.    Assessment & Plan Helen Filler MD; 05/23/2015 10:15 AM) SYMPTOMATIC CHOLELITHIASIS (574.20  K80.20) Impression: 29 year old female with symptomatic cholelithiasis  1. The patient like to proceed to the operating for a laparoscopic cholecystectomy. 2. Risks and benefits were discussed with the patient to generally include, but not limited to: infection, bleeding, possible need  for post op ERCP, damage to the bile ducts, bile leak, and possible need for further surgery. Alternatives were offered and described. All questions were answered and the patient voiced understanding of the procedure and wishes to proceed at this point with a laparoscopic cholecystectomy

## 2015-06-01 ENCOUNTER — Encounter (HOSPITAL_COMMUNITY): Payer: Self-pay | Admitting: General Surgery

## 2015-06-01 NOTE — Anesthesia Postprocedure Evaluation (Signed)
  Anesthesia Post-op Note  Patient: Helen Yates  Procedure(s) Performed: Procedure(s): LAPAROSCOPIC CHOLECYSTECTOMY (N/A)  Patient Location: PACU  Anesthesia Type:General  Level of Consciousness: awake, alert  and oriented  Airway and Oxygen Therapy: Patient Spontanous Breathing  Post-op Pain: mild  Post-op Assessment: Post-op Vital signs reviewed and Patient's Cardiovascular Status Stable              Post-op Vital Signs: Reviewed and stable  Last Vitals:  Filed Vitals:   05/31/15 1530  BP:   Pulse:   Temp: 36.4 C  Resp:     Complications: No apparent anesthesia complications

## 2016-10-17 IMAGING — CR DG THORACIC SPINE 2V
3 series · 3 of 3 positions shown · non-contrast
Comparison: None.

CLINICAL DATA: Acute onset of upper back pain and vomiting. Initial
encounter.

EXAM:
THORACIC SPINE 2 VIEWS

[AP]
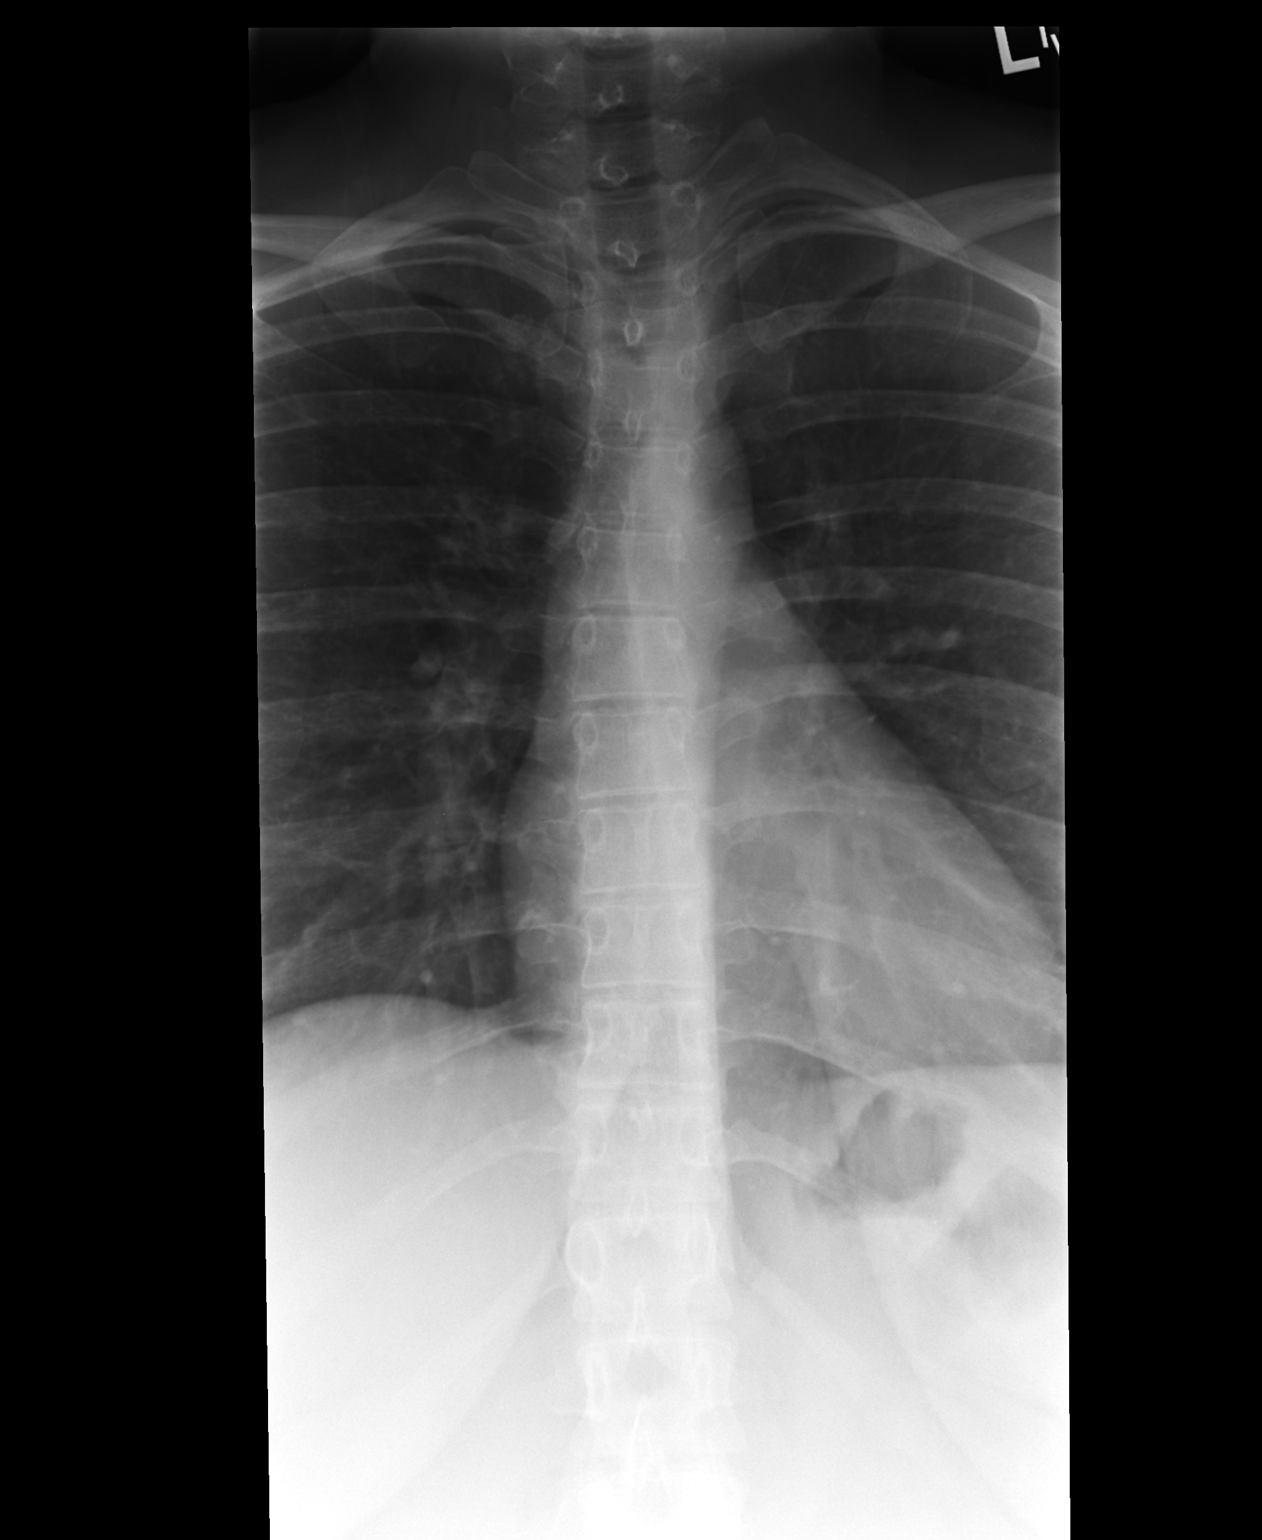

[lateral]
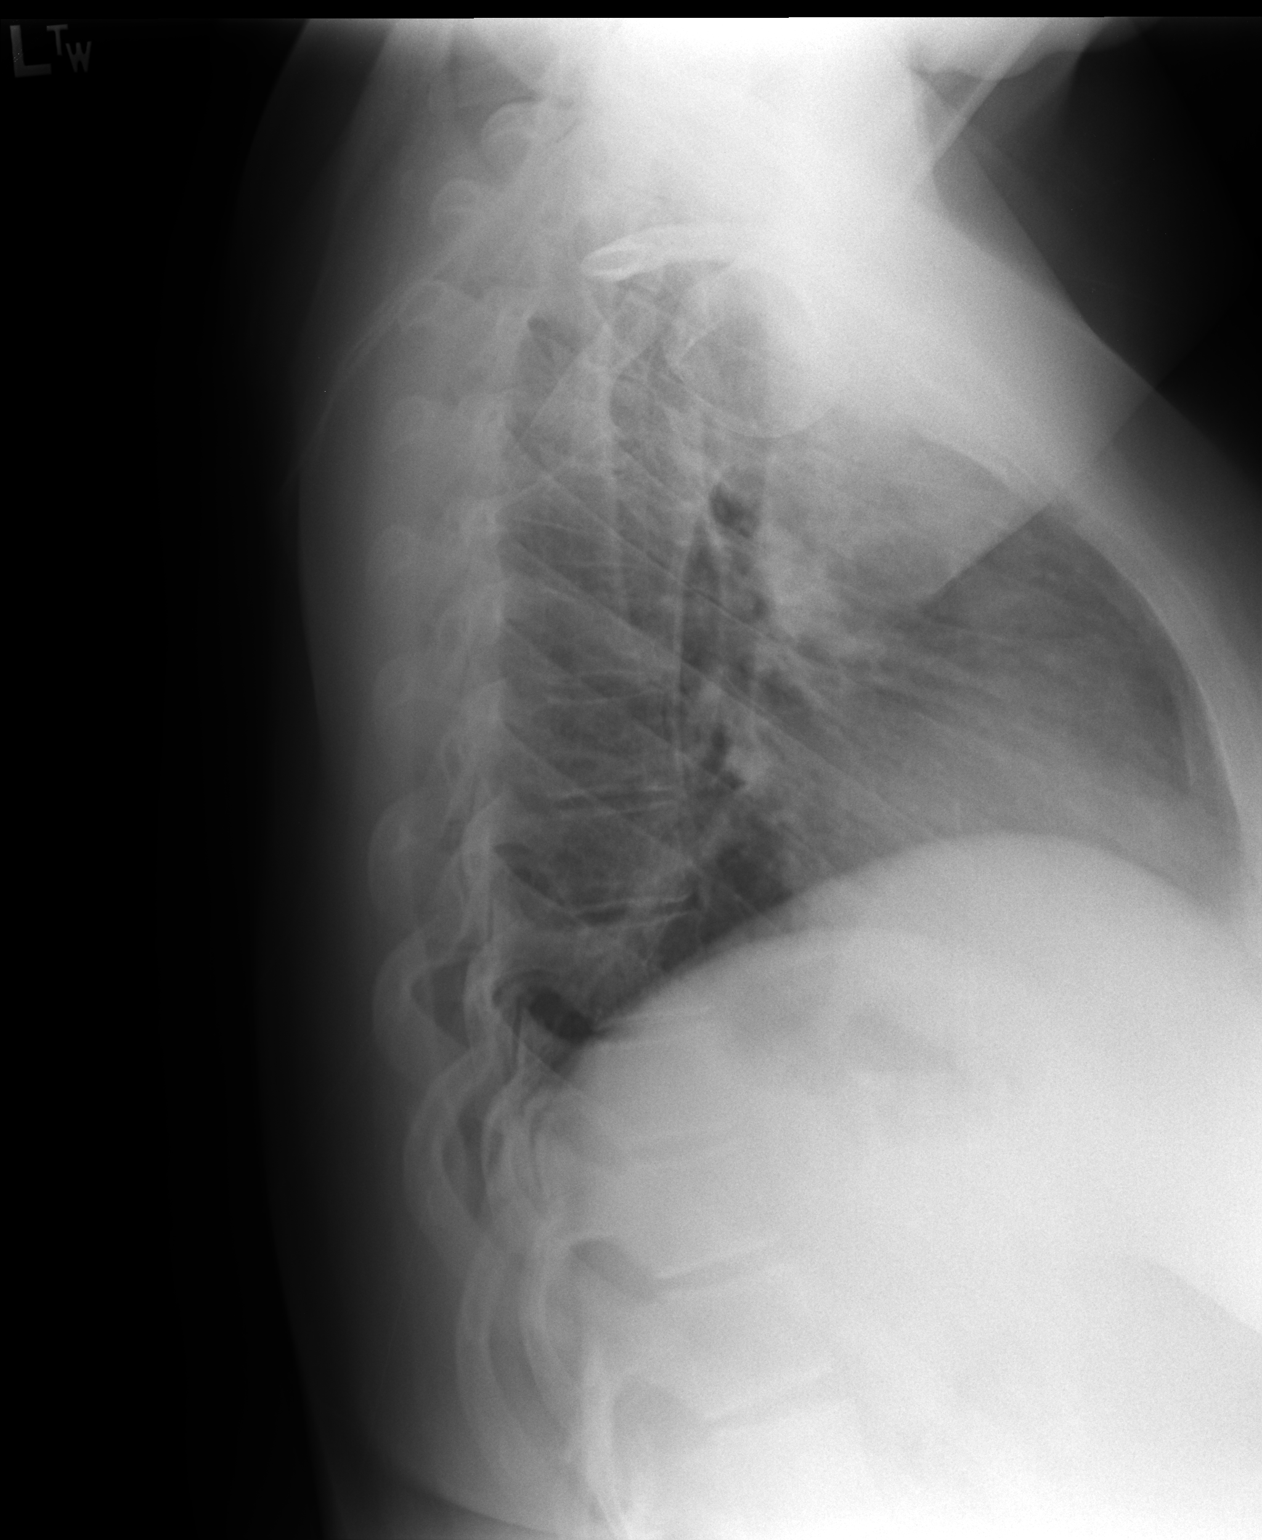

[swimmers]
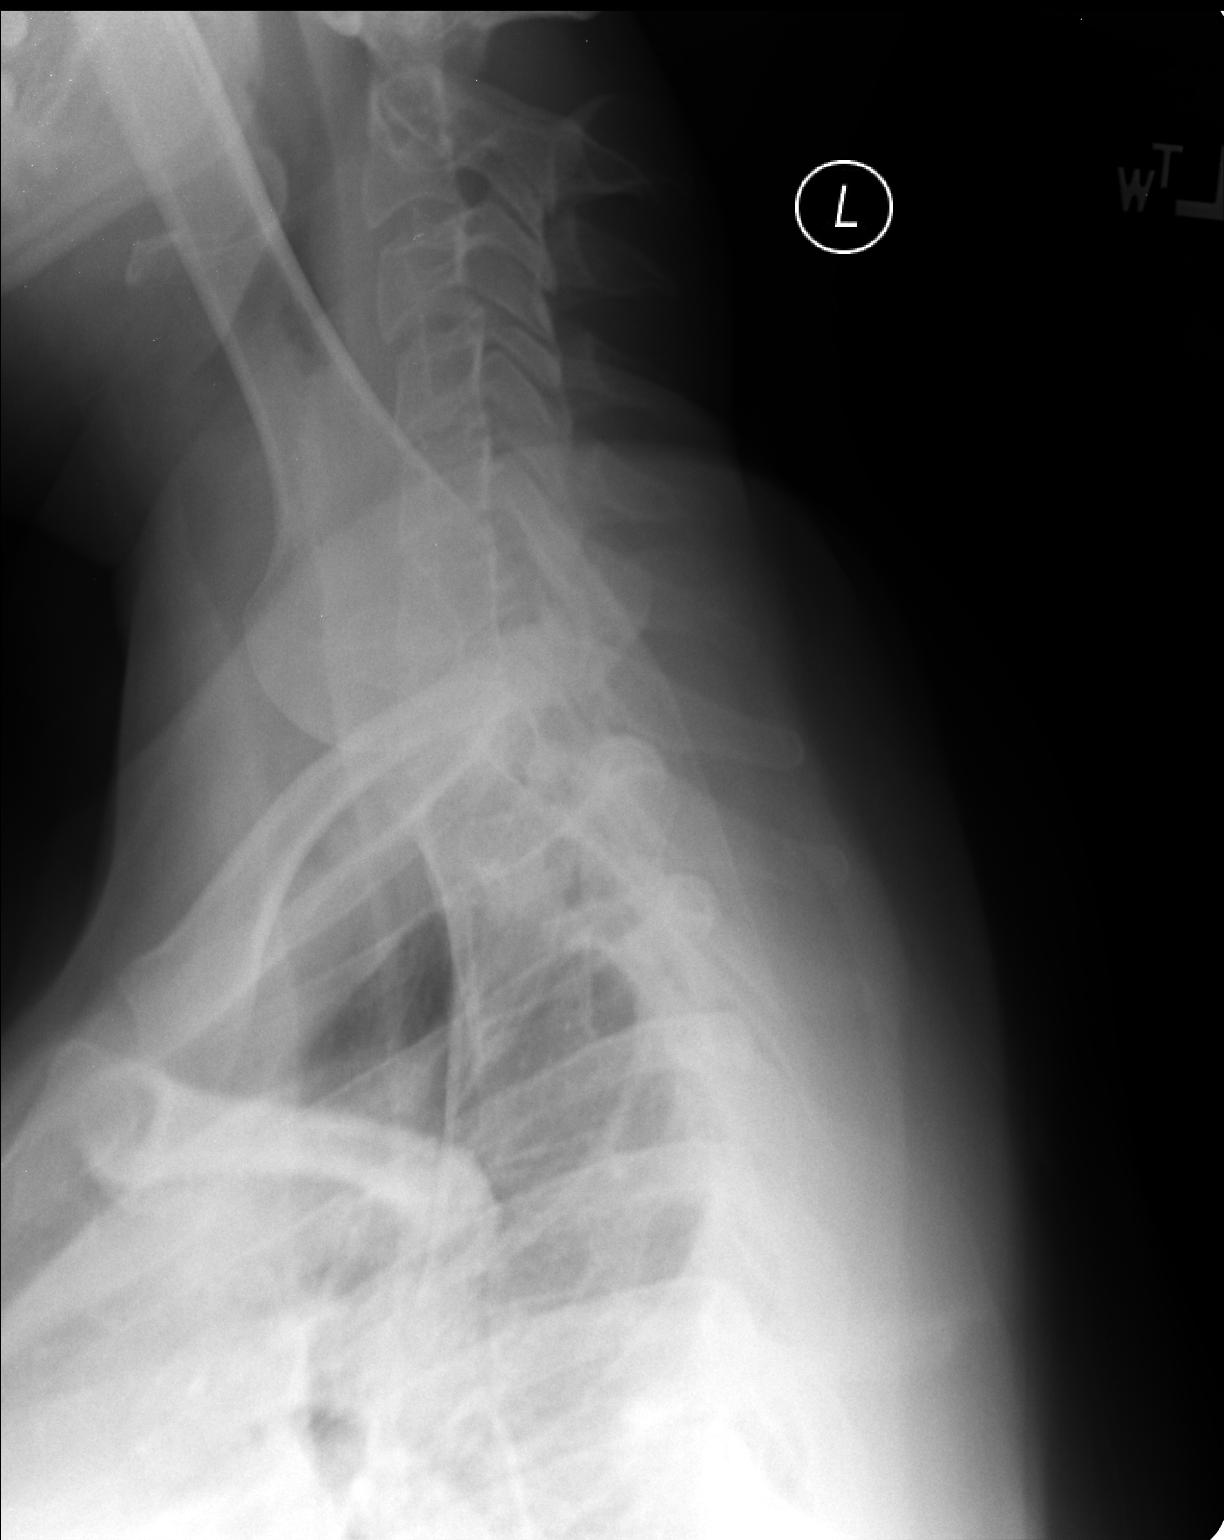

[3 of 3 positions shown; findings below may reference images not displayed]

FINDINGS: There is no evidence of fracture or subluxation. Vertebral bodies
demonstrate normal height and alignment. Intervertebral disc spaces
are preserved.

The visualized portions of both lungs are clear. The mediastinum is
unremarkable in appearance.
IMPRESSION: No evidence of fracture or subluxation along the thoracic spine.

## 2016-10-19 IMAGING — US US ABDOMEN COMPLETE
1 series · 13 of 25 positions shown · non-contrast
Comparison: None.

CLINICAL DATA: Abdominal pain, epigastric pain, possible
cholelithiasis

EXAM:
ULTRASOUND ABDOMEN COMPLETE

[Series 1: us abdomen complete · 0.32mm/px · 13 of 72 slices shown]
[im 1/72]
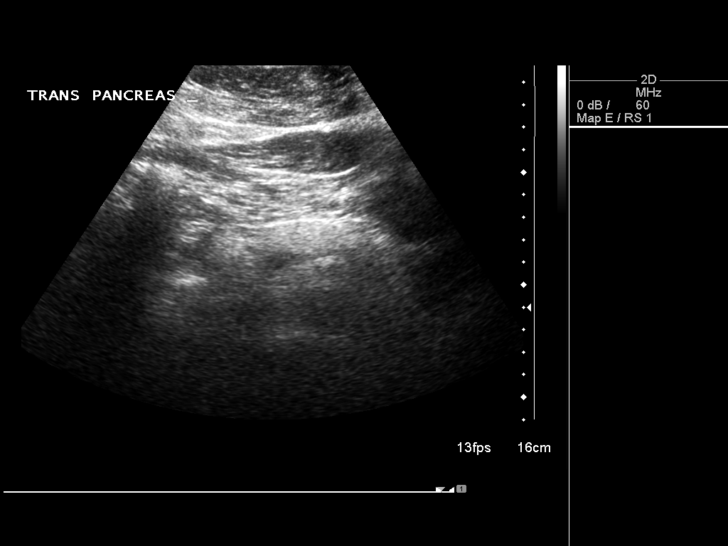
[im 6/72]
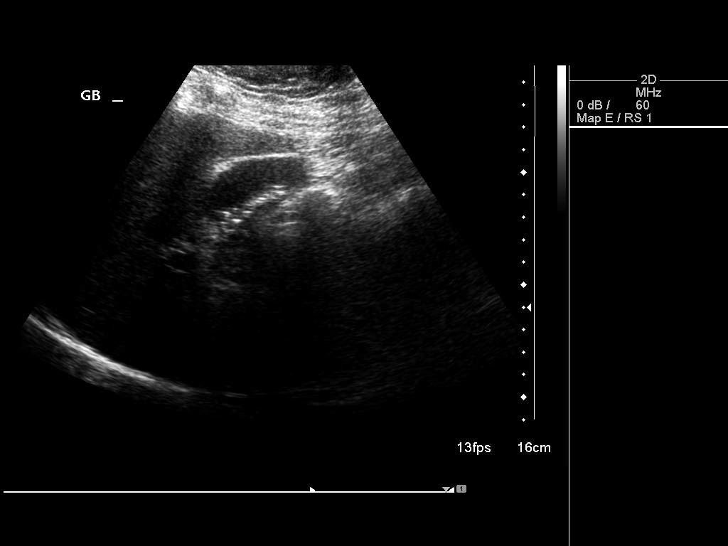
[im 12/72]
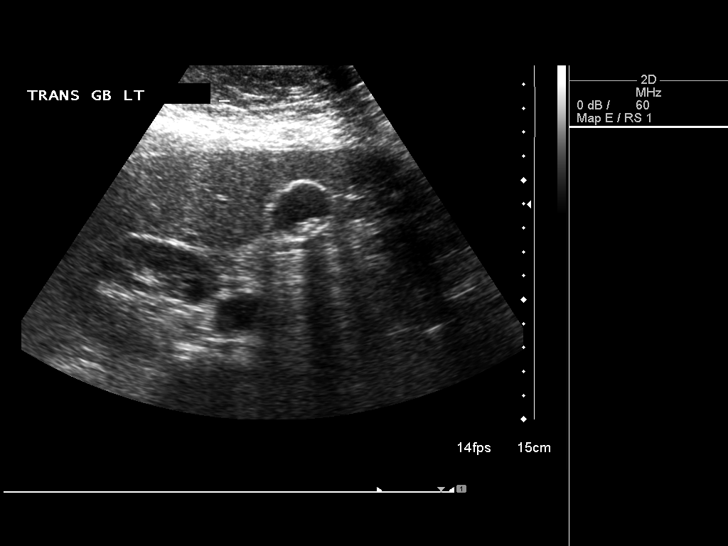
[im 18/72]
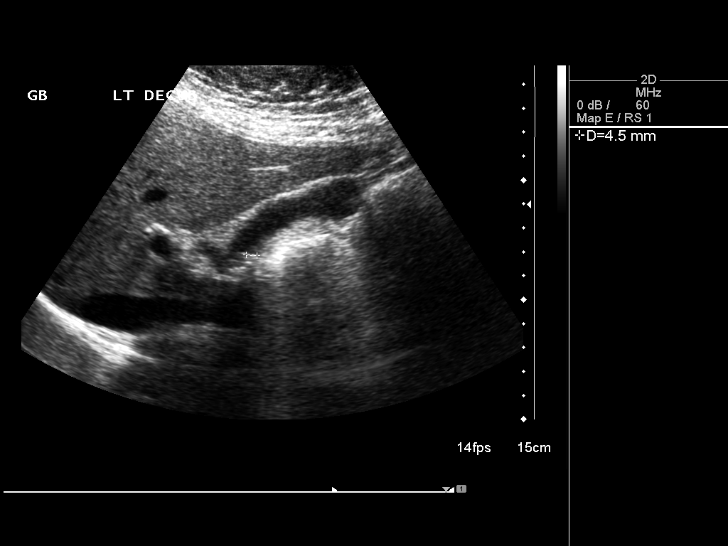
[im 24/72]
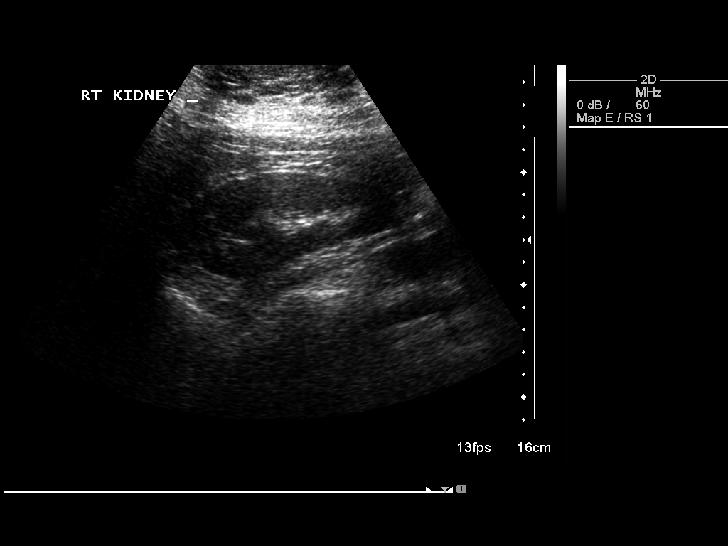
[im 30/72]
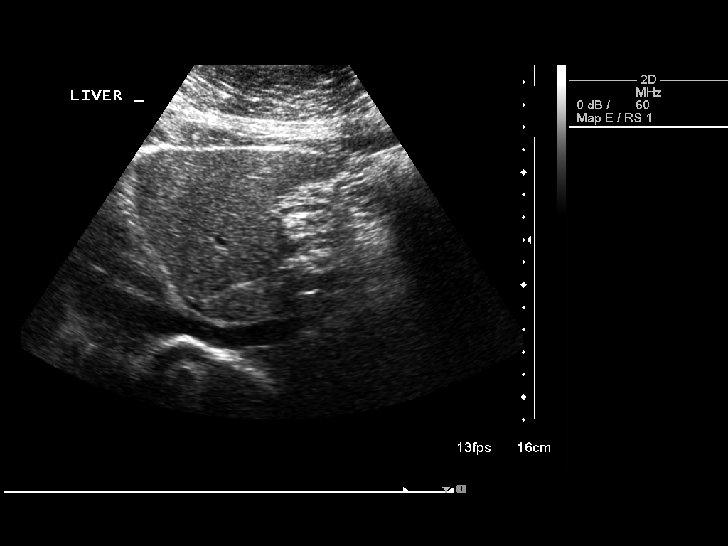
[im 36/72]
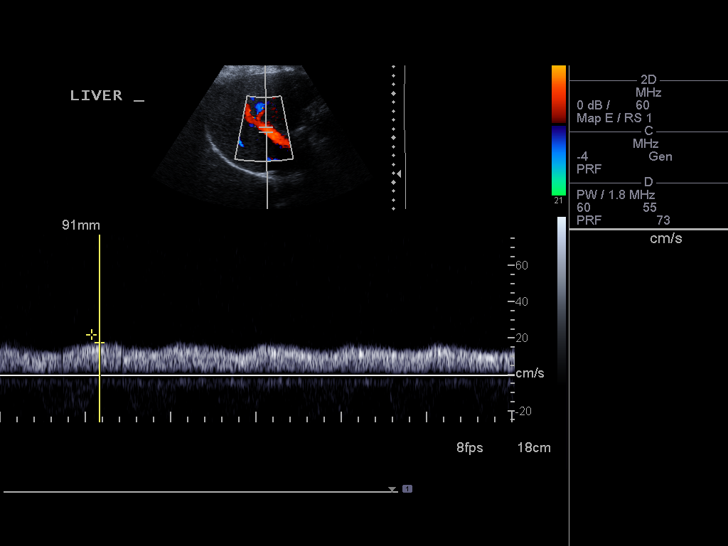
[im 42/72]
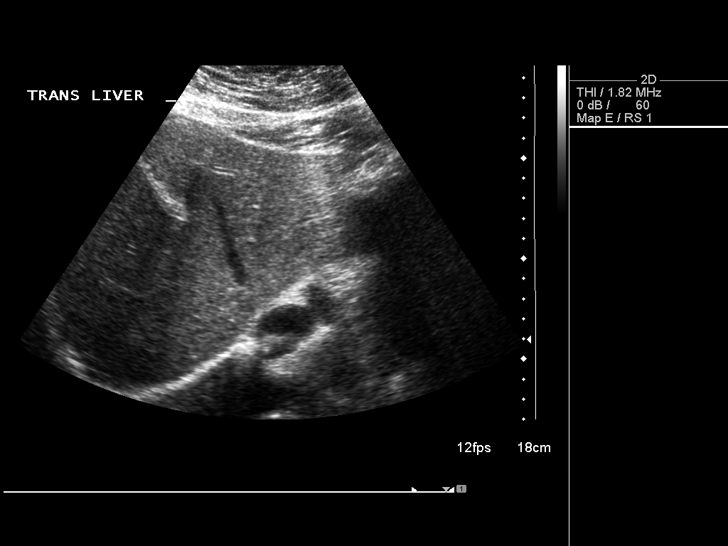
[im 48/72]
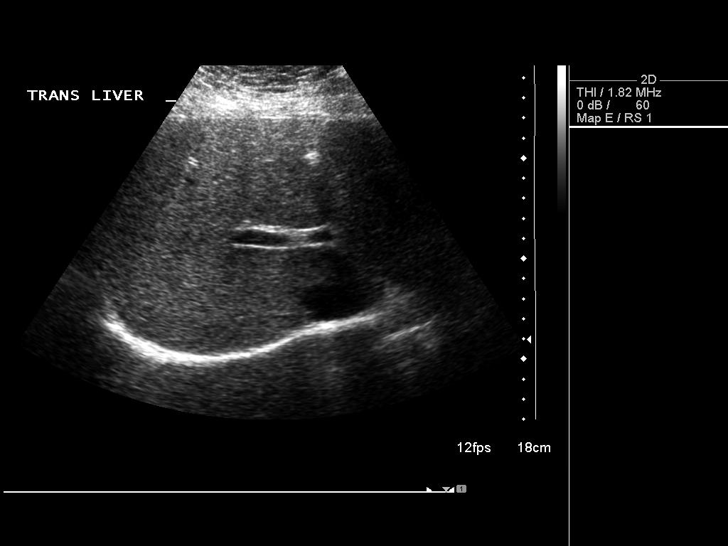
[im 54/72]
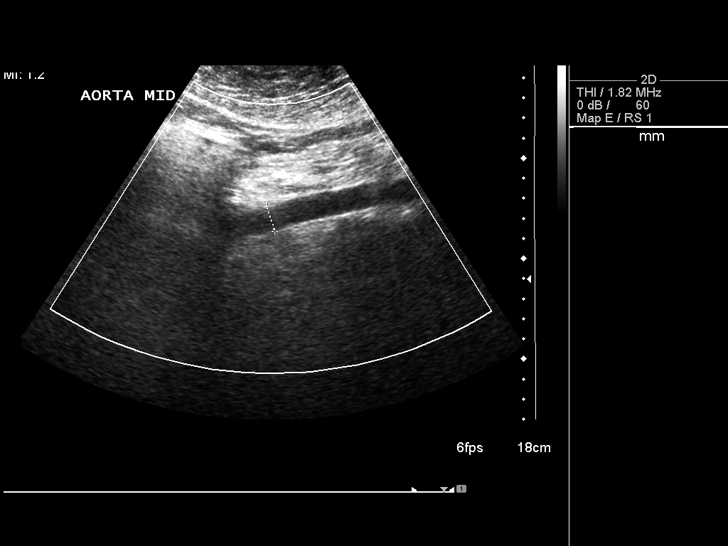
[im 60/72]
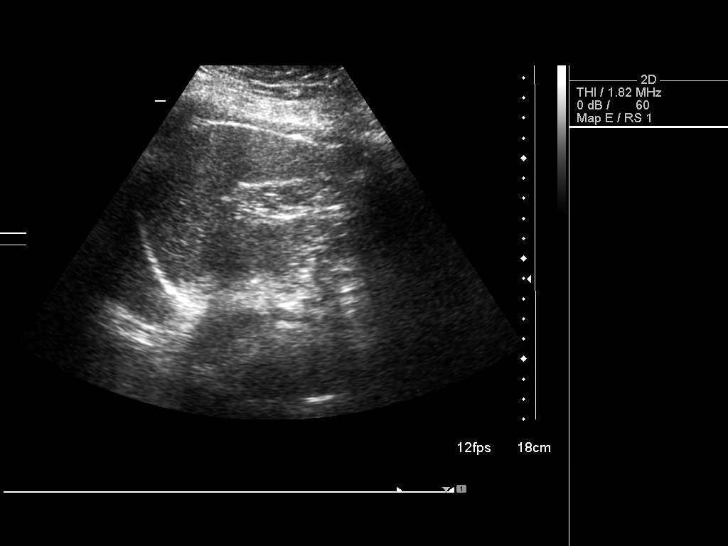
[im 66/72]
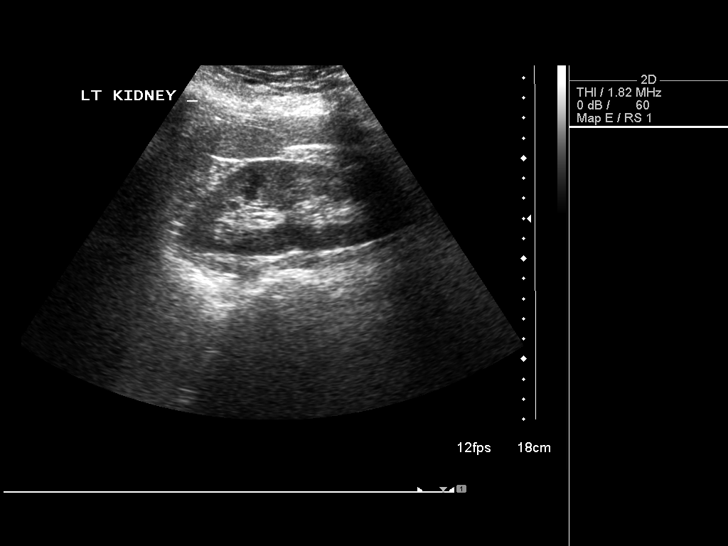
[im 72/72]
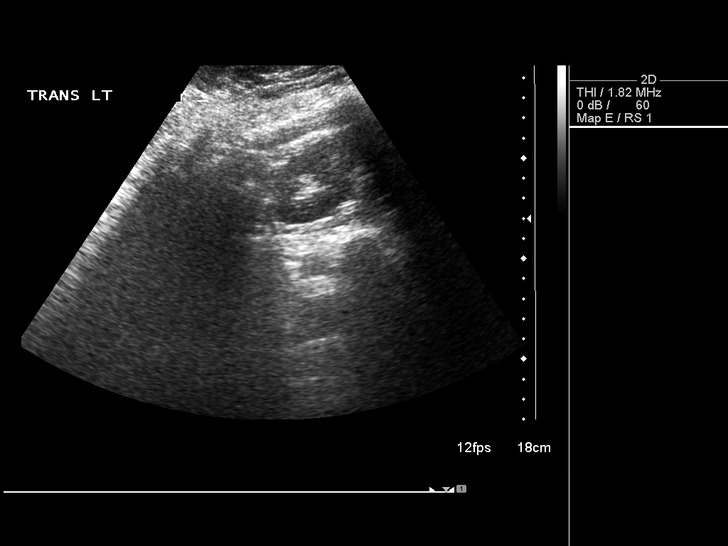

[13 of 25 positions shown; findings below may reference images not displayed]

FINDINGS: Gallbladder: Multiple gallstones are noted within gallbladder the
largest measures 5 mm. No thickening of gallbladder wall. There is
positive sonographic Murphy sign. Clinical correlation is necessary
to exclude early cholecystitis.

Common bile duct: Diameter: 5.6 mm in diameter mild prominent in
size

Liver: No focal lesion identified. Within normal limits in
parenchymal echogenicity.

IVC: No abnormality visualized.

Pancreas: Visualized portion unremarkable.

Spleen: Size and appearance within normal limits. Measures 8.2 cm in
length.

Right Kidney: Length: 10.9 cm. Echogenicity within normal limits. No
mass or hydronephrosis visualized.

Left Kidney: Length: 10.8 cm. Echogenicity within normal limits. No
mass or hydronephrosis visualized.

Abdominal aorta: No aneurysm visualized. Measures up to 1.4 cm in
diameter.

Other findings: None.
IMPRESSION: 1. Multiple gallstones are noted within gallbladder the largest
measures 5 mm. No thickening of gallbladder wall. There is positive
sonographic Murphy sign. Clinical correlation is necessary to
exclude early cholecystitis.
2. Mild prominent size CBD.
3. No hydronephrosis or diagnostic renal calculus.
4. No aortic aneurysm.

## 2018-10-01 NOTE — L&D Delivery Note (Signed)
Delivery Note At 1:24 PM a viable female was delivered via Vaginal, Spontaneous (Presentation DOA: ;  ).  APGAR: 8, 9; weight pending .   Placenta status: Spontaneous, intact, .  Cord: Three-vessel cord with the following complications: .  None cord pH: Not applicable  Anesthesia: Epidural Episiotomy: None Lacerations: 2nd degree;Perineal Suture Repair: 3.0 vicryl rapide Est. Blood Loss (mL): 500  Mom to postpartum.  Baby to Couplet care / Skin to Skin.  Ala Dach 07/13/2019, 1:40 PM

## 2018-12-29 LAB — OB RESULTS CONSOLE GC/CHLAMYDIA
Chlamydia: NEGATIVE
Gonorrhea: NEGATIVE

## 2018-12-29 LAB — OB RESULTS CONSOLE RPR: RPR: NONREACTIVE

## 2018-12-29 LAB — OB RESULTS CONSOLE HIV ANTIBODY (ROUTINE TESTING): HIV: NONREACTIVE

## 2018-12-29 LAB — OB RESULTS CONSOLE ABO/RH: RH Type: POSITIVE

## 2018-12-29 LAB — OB RESULTS CONSOLE HEPATITIS B SURFACE ANTIGEN: Hepatitis B Surface Ag: NEGATIVE

## 2018-12-29 LAB — OB RESULTS CONSOLE ANTIBODY SCREEN: Antibody Screen: NEGATIVE

## 2018-12-29 LAB — OB RESULTS CONSOLE RUBELLA ANTIBODY, IGM: Rubella: IMMUNE

## 2019-06-29 LAB — OB RESULTS CONSOLE GBS: GBS: NEGATIVE

## 2019-07-08 ENCOUNTER — Telehealth (HOSPITAL_COMMUNITY): Payer: Self-pay | Admitting: *Deleted

## 2019-07-08 ENCOUNTER — Encounter (HOSPITAL_COMMUNITY): Payer: Self-pay | Admitting: *Deleted

## 2019-07-08 NOTE — Telephone Encounter (Signed)
Preadmission screen  

## 2019-07-12 ENCOUNTER — Other Ambulatory Visit: Payer: Self-pay

## 2019-07-12 ENCOUNTER — Inpatient Hospital Stay (HOSPITAL_COMMUNITY): Payer: BC Managed Care – PPO | Admitting: Anesthesiology

## 2019-07-12 ENCOUNTER — Encounter (HOSPITAL_COMMUNITY): Payer: Self-pay

## 2019-07-12 ENCOUNTER — Inpatient Hospital Stay (HOSPITAL_COMMUNITY)
Admission: AD | Admit: 2019-07-12 | Discharge: 2019-07-15 | DRG: 807 | Disposition: A | Discharge status: Home / Self Care | Payer: BC Managed Care – PPO | Attending: Obstetrics | Admitting: Obstetrics

## 2019-07-12 DIAGNOSIS — Z3A38 38 weeks gestation of pregnancy: Secondary | ICD-10-CM | POA: Diagnosis not present

## 2019-07-12 DIAGNOSIS — F329 Major depressive disorder, single episode, unspecified: Secondary | ICD-10-CM | POA: Diagnosis present

## 2019-07-12 DIAGNOSIS — E669 Obesity, unspecified: Secondary | ICD-10-CM | POA: Diagnosis present

## 2019-07-12 DIAGNOSIS — F419 Anxiety disorder, unspecified: Secondary | ICD-10-CM | POA: Diagnosis present

## 2019-07-12 DIAGNOSIS — O9962 Diseases of the digestive system complicating childbirth: Principal | ICD-10-CM | POA: Diagnosis present

## 2019-07-12 DIAGNOSIS — F429 Obsessive-compulsive disorder, unspecified: Secondary | ICD-10-CM | POA: Diagnosis present

## 2019-07-12 DIAGNOSIS — O99344 Other mental disorders complicating childbirth: Secondary | ICD-10-CM | POA: Diagnosis present

## 2019-07-12 DIAGNOSIS — K219 Gastro-esophageal reflux disease without esophagitis: Secondary | ICD-10-CM | POA: Diagnosis present

## 2019-07-12 DIAGNOSIS — O9902 Anemia complicating childbirth: Secondary | ICD-10-CM | POA: Diagnosis present

## 2019-07-12 DIAGNOSIS — O99214 Obesity complicating childbirth: Secondary | ICD-10-CM | POA: Diagnosis present

## 2019-07-12 DIAGNOSIS — Z20828 Contact with and (suspected) exposure to other viral communicable diseases: Secondary | ICD-10-CM | POA: Diagnosis present

## 2019-07-12 DIAGNOSIS — O26893 Other specified pregnancy related conditions, third trimester: Secondary | ICD-10-CM | POA: Diagnosis present

## 2019-07-12 DIAGNOSIS — Z3490 Encounter for supervision of normal pregnancy, unspecified, unspecified trimester: Secondary | ICD-10-CM

## 2019-07-12 LAB — CBC
HCT: 32.3 % — ABNORMAL LOW (ref 36.0–46.0)
Hemoglobin: 11.5 g/dL — ABNORMAL LOW (ref 12.0–15.0)
MCH: 33.1 pg (ref 26.0–34.0)
MCHC: 35.6 g/dL (ref 30.0–36.0)
MCV: 93.1 fL (ref 80.0–100.0)
Platelets: 209 10*3/uL (ref 150–400)
RBC: 3.47 MIL/uL — ABNORMAL LOW (ref 3.87–5.11)
RDW: 14 % (ref 11.5–15.5)
WBC: 8.9 10*3/uL (ref 4.0–10.5)
nRBC: 0 % (ref 0.0–0.2)

## 2019-07-12 LAB — COMPREHENSIVE METABOLIC PANEL
ALT: 30 U/L (ref 0–44)
AST: 29 U/L (ref 15–41)
Albumin: 2.5 g/dL — ABNORMAL LOW (ref 3.5–5.0)
Alkaline Phosphatase: 156 U/L — ABNORMAL HIGH (ref 38–126)
Anion gap: 13 (ref 5–15)
BUN: 6 mg/dL (ref 6–20)
CO2: 17 mmol/L — ABNORMAL LOW (ref 22–32)
Calcium: 8.9 mg/dL (ref 8.9–10.3)
Chloride: 106 mmol/L (ref 98–111)
Creatinine, Ser: 0.61 mg/dL (ref 0.44–1.00)
GFR calc Af Amer: >60 mL/min (ref >60)
GFR calc non Af Amer: >60 mL/min (ref >60)
Glucose, Bld: 82 mg/dL (ref 70–99)
Potassium: 3.7 mmol/L (ref 3.5–5.1)
Sodium: 136 mmol/L (ref 135–145)
Total Bilirubin: 0.6 mg/dL (ref 0.3–1.2)
Total Protein: 6 g/dL — ABNORMAL LOW (ref 6.5–8.1)

## 2019-07-12 LAB — URIC ACID: Uric Acid, Serum: 5.1 mg/dL (ref 2.5–7.1)

## 2019-07-12 LAB — TYPE AND SCREEN
ABO/RH(D): B POS
Antibody Screen: NEGATIVE

## 2019-07-12 LAB — SARS CORONAVIRUS 2 BY RT PCR (HOSPITAL ORDER, PERFORMED IN ~~LOC~~ HOSPITAL LAB): SARS Coronavirus 2: NEGATIVE

## 2019-07-12 MED ORDER — LACTATED RINGERS IV SOLN
500.0000 mL | Freq: Once | INTRAVENOUS | Status: AC
  Administered 2019-07-12: 500 mL via INTRAVENOUS

## 2019-07-12 MED ORDER — LACTATED RINGERS IV SOLN
INTRAVENOUS | Status: DC
  Administered 2019-07-12 – 2019-07-13 (×3): via INTRAVENOUS

## 2019-07-12 MED ORDER — ACETAMINOPHEN 325 MG PO TABS: 650.0000 mg | ORAL_TABLET | ORAL | Status: DC | PRN

## 2019-07-12 MED ORDER — PHENYLEPHRINE 40 MCG/ML (10ML) SYRINGE FOR IV PUSH (FOR BLOOD PRESSURE SUPPORT): 80.0000 ug | PREFILLED_SYRINGE | INTRAVENOUS | Status: DC | PRN

## 2019-07-12 MED ORDER — OXYTOCIN 40 UNITS IN NORMAL SALINE INFUSION - SIMPLE MED
2.5000 [IU]/h | INTRAVENOUS | Status: DC
  Filled 2019-07-12: qty 1000

## 2019-07-12 MED ORDER — OXYTOCIN BOLUS FROM INFUSION
500.0000 mL | Freq: Once | INTRAVENOUS | Status: AC
  Administered 2019-07-13: 500 mL via INTRAVENOUS

## 2019-07-12 MED ORDER — SODIUM CHLORIDE (PF) 0.9 % IJ SOLN
INTRAMUSCULAR | Status: DC | PRN
  Administered 2019-07-12: 12 mL/h via EPIDURAL

## 2019-07-12 MED ORDER — OXYCODONE-ACETAMINOPHEN 5-325 MG PO TABS: 2.0000 | ORAL_TABLET | ORAL | Status: DC | PRN

## 2019-07-12 MED ORDER — LACTATED RINGERS IV SOLN: 500.0000 mL | INTRAVENOUS | Status: DC | PRN

## 2019-07-12 MED ORDER — DIPHENHYDRAMINE HCL 50 MG/ML IJ SOLN
12.5000 mg | INTRAMUSCULAR | Status: DC | PRN
  Administered 2019-07-13: 04:00:00 12.5 mg via INTRAVENOUS
  Filled 2019-07-12: qty 1

## 2019-07-12 MED ORDER — ONDANSETRON HCL 4 MG/2ML IJ SOLN: 4.0000 mg | Freq: Four times a day (QID) | INTRAMUSCULAR | Status: DC | PRN

## 2019-07-12 MED ORDER — FENTANYL-BUPIVACAINE-NACL 0.5-0.125-0.9 MG/250ML-% EP SOLN: 12.0000 mL/h | EPIDURAL | Status: DC | PRN

## 2019-07-12 MED ORDER — LIDOCAINE HCL (PF) 1 % IJ SOLN
INTRAMUSCULAR | Status: DC | PRN
  Administered 2019-07-12: 5 mL via EPIDURAL

## 2019-07-12 MED ORDER — OXYCODONE-ACETAMINOPHEN 5-325 MG PO TABS: 1.0000 | ORAL_TABLET | ORAL | Status: DC | PRN

## 2019-07-12 MED ORDER — FENTANYL-BUPIVACAINE-NACL 0.5-0.125-0.9 MG/250ML-% EP SOLN
EPIDURAL | Status: AC
  Filled 2019-07-12: qty 250

## 2019-07-12 MED ORDER — LIDOCAINE HCL (PF) 1 % IJ SOLN: 30.0000 mL | INTRAMUSCULAR | Status: DC | PRN

## 2019-07-12 MED ORDER — SOD CITRATE-CITRIC ACID 500-334 MG/5ML PO SOLN
30.0000 mL | ORAL | Status: DC | PRN
  Administered 2019-07-13: 30 mL via ORAL
  Filled 2019-07-12: qty 30

## 2019-07-12 MED ORDER — EPHEDRINE 5 MG/ML INJ: 10.0000 mg | INTRAVENOUS | Status: DC | PRN

## 2019-07-12 MED ORDER — FLEET ENEMA 7-19 GM/118ML RE ENEM: 1.0000 | ENEMA | RECTAL | Status: DC | PRN

## 2019-07-12 NOTE — Anesthesia Procedure Notes (Signed)
Epidural Patient location during procedure: OB Start time: 07/12/2019 11:27 PM End time: 07/12/2019 11:38 PM  Staffing Anesthesiologist: Barnet Glasgow, MD Performed: anesthesiologist   Preanesthetic Checklist Completed: patient identified, site marked, surgical consent, pre-op evaluation, timeout performed, IV checked, risks and benefits discussed and monitors and equipment checked  Epidural Patient position: sitting Prep: site prepped and draped and DuraPrep Patient monitoring: continuous pulse ox and blood pressure Approach: midline Location: L3-L4 Injection technique: LOR air  Needle:  Needle type: Tuohy  Needle gauge: 17 G Needle length: 9 cm and 9 Needle insertion depth: 8 cm Catheter type: closed end flexible Catheter size: 19 Gauge Catheter at skin depth: 14 cm Test dose: negative  Assessment Events: blood not aspirated, injection not painful, no injection resistance, negative IV test and no paresthesia  Additional Notes Patient identified. Risks/Benefits/Options discussed with patient including but not limited to bleeding, infection, nerve damage, paralysis, failed block, incomplete pain control, headache, blood pressure changes, nausea, vomiting, reactions to medication both or allergic, itching and postpartum back pain. Confirmed with bedside nurse the patient's most recent platelet count. Confirmed with patient that they are not currently taking any anticoagulation, have any bleeding history or any family history of bleeding disorders. Patient expressed understanding and wished to proceed. All questions were answered. Sterile technique was used throughout the entire procedure. Please see nursing notes for vital signs. Test dose was given through epidural needle and negative prior to continuing to dose epidural or start infusion. Warning signs of high block given to the patient including shortness of breath, tingling/numbness in hands, complete motor block, or any  concerning symptoms with instructions to call for help. Patient was given instructions on fall risk and not to get out of bed. All questions and concerns addressed with instructions to call with any issues. 1 Attempt (S) . Patient tolerated procedure well.

## 2019-07-12 NOTE — Anesthesia Preprocedure Evaluation (Signed)
Anesthesia Evaluation  Patient identified by MRN, date of birth, ID band Patient awake    Reviewed: Allergy & Precautions, NPO status , Patient's Chart, lab work & pertinent test results  Airway Mallampati: III  TM Distance: >3 FB Neck ROM: Full    Dental no notable dental hx. (+) Teeth Intact   Pulmonary neg pulmonary ROS,    Pulmonary exam normal breath sounds clear to auscultation       Cardiovascular Exercise Tolerance: Good negative cardio ROS Normal cardiovascular exam Rhythm:Regular Rate:Normal     Neuro/Psych  Headaches, negative psych ROS   GI/Hepatic Neg liver ROS, GERD  ,  Endo/Other  negative endocrine ROS  Renal/GU negative Renal ROS     Musculoskeletal   Abdominal (+) + obese,   Peds  Hematology Hgb 11.5 Plt 209   Anesthesia Other Findings   Reproductive/Obstetrics (+) Pregnancy                             Anesthesia Physical Anesthesia Plan  ASA: III  Anesthesia Plan: Epidural   Post-op Pain Management:    Induction:   PONV Risk Score and Plan:   Airway Management Planned:   Additional Equipment:   Intra-op Plan:   Post-operative Plan:   Informed Consent: I have reviewed the patients History and Physical, chart, labs and discussed the procedure including the risks, benefits and alternatives for the proposed anesthesia with the patient or authorized representative who has indicated his/her understanding and acceptance.       Plan Discussed with:   Anesthesia Plan Comments: (38 wk G3P1 for LEA)        Anesthesia Quick Evaluation

## 2019-07-12 NOTE — MAU Note (Signed)
Pt here with c/o contractions that started all day and have gotten worse today. Reports they are every 6 mins for about 1 hour now. Denies LOF or vaginal bleeding. Reports good fetal movement. Cervix was 1cm last week.

## 2019-07-12 NOTE — H&P (Signed)
Helen Yates is a 32 y.o. G3P1011 at [redacted]w[redacted]d presenting for labor. Pt notes onset contractions around 5pm and getting stronger . Good fetal movement, No vaginal bleeding, not leaking fluid. Mild HA as per her usual chronic HA.   [facility] at [facility] since 6 wks - PCOS, Femara pregnancy - Dated by LMP c/w 6 wk u/s - Early 1 hr GTT 144, nl 3 hr GTT in 1st and 3rd trimesters - Zoloft - Hyperemesis with GERD, 18# wt loss during pregnancy despite Pepcid, Zofran, Diclegis and Phenergan - AGA, nl growth u/s at 36 wks - Chronic HA, weekly Fiorcet   Prenatal Transfer Tool  Maternal Diabetes: No Genetic Screening: Normal Maternal Ultrasounds/Referrals: Normal Fetal Ultrasounds or other Referrals:  None Maternal Substance Abuse:  No Significant Maternal Medications:  None Significant Maternal Lab Results: Group B Strep negative     OB History    Gravida  3   Para  1   Term  1   Preterm  0   AB  1   Living  1     SAB  1   TAB  0   Ectopic  0   Multiple  0   Live Births  1          Past Medical History:  Diagnosis Date  . Allergy   . Anemia    low iron  . Dysmenorrhea   . GERD (gastroesophageal reflux disease)   . Infertility, female   . Migraines    Past Surgical History:  Procedure Laterality Date  . CHOLECYSTECTOMY N/A 05/31/2015   Procedure: LAPAROSCOPIC CHOLECYSTECTOMY;  Surgeon: Ralene Ok, MD;  Location: [facility] OR;  Service: General;  Laterality: N/A;  . WISDOM TOOTH EXTRACTION     Family History: family history includes Alcohol abuse in her maternal grandfather; Arthritis in her father, maternal grandfather, maternal grandmother, and mother; Asthma in her mother; Breast cancer in her paternal grandmother; Cancer in her paternal grandmother; Depression in her mother; Diabetes in her maternal grandfather and paternal grandmother; Drug abuse in her brother; Fibromyalgia in her mother; Heart disease in her maternal grandfather and maternal grandmother;  Hyperlipidemia in her maternal grandfather and paternal grandmother; Hypertension in her father; Migraines in her father; Miscarriages / [place] in her mother; Osteoporosis in her mother. Social History:  reports that she has never smoked. She has never used smokeless tobacco. She reports that she does not drink alcohol or use drugs.  Review of Systems - Negative except contractions   Dilation: 4 Effacement (%): 50 Station: -2 Exam by:: Army Fossa RN Blood pressure 128/87, pulse (!) 103, temperature 98.5 F (36.9 C), temperature source Oral, resp. rate 19, height 5\' 6"  (1.676 m), weight 122.2 kg, SpO2 98 %.    Prenatal labs: ABO, Rh: B/Positive/-- (03/30 0000) Antibody: Negative (03/30 0000) Rubella: Immune (03/30 0000) RPR: Nonreactive (03/30 0000)  HBsAg: Negative (03/30 0000)  HIV: Non-reactive (03/30 0000)  GBS: Negative/-- (09/28 0000)  1 hr Glucola 144, nl 3 hr GTT  Genetic screening nl NT, nl AFP Anatomy US normal   Assessment/Plan: 33 y.o. G3P1011 at [redacted]w[redacted]d Term, mulitp, active labor, plan expectant management GBS neg   Ala Dach 07/12/2019, 10:01 PM

## 2019-07-13 ENCOUNTER — Encounter (HOSPITAL_COMMUNITY): Payer: Self-pay | Admitting: *Deleted

## 2019-07-13 LAB — ABO/RH: ABO/RH(D): B POS

## 2019-07-13 LAB — RPR: RPR Ser Ql: NONREACTIVE

## 2019-07-13 MED ORDER — TETANUS-DIPHTH-ACELL PERTUSSIS 5-2.5-18.5 LF-MCG/0.5 IM SUSP: 0.5000 mL | Freq: Once | INTRAMUSCULAR | Status: DC

## 2019-07-13 MED ORDER — IBUPROFEN 600 MG PO TABS
600.0000 mg | ORAL_TABLET | Freq: Four times a day (QID) | ORAL | Status: DC
  Administered 2019-07-13 – 2019-07-15 (×7): 600 mg via ORAL
  Filled 2019-07-13 (×7): qty 1

## 2019-07-13 MED ORDER — DIBUCAINE (PERIANAL) 1 % EX OINT: 1.0000 "application " | TOPICAL_OINTMENT | CUTANEOUS | Status: DC | PRN

## 2019-07-13 MED ORDER — ZOLPIDEM TARTRATE 5 MG PO TABS: 5.0000 mg | ORAL_TABLET | Freq: Every evening | ORAL | Status: DC | PRN

## 2019-07-13 MED ORDER — SIMETHICONE 80 MG PO CHEW: 80.0000 mg | CHEWABLE_TABLET | ORAL | Status: DC | PRN

## 2019-07-13 MED ORDER — WITCH HAZEL-GLYCERIN EX PADS: 1.0000 "application " | MEDICATED_PAD | CUTANEOUS | Status: DC | PRN

## 2019-07-13 MED ORDER — SENNOSIDES-DOCUSATE SODIUM 8.6-50 MG PO TABS
2.0000 | ORAL_TABLET | ORAL | Status: DC
  Administered 2019-07-13 – 2019-07-15 (×2): 2 via ORAL
  Filled 2019-07-13 (×2): qty 2

## 2019-07-13 MED ORDER — COCONUT OIL OIL: 1.0000 "application " | TOPICAL_OIL | Status: DC | PRN

## 2019-07-13 MED ORDER — TERBUTALINE SULFATE 1 MG/ML IJ SOLN: 0.2500 mg | Freq: Once | INTRAMUSCULAR | Status: DC | PRN

## 2019-07-13 MED ORDER — PRENATAL MULTIVITAMIN CH
1.0000 | ORAL_TABLET | Freq: Every day | ORAL | Status: DC
  Administered 2019-07-14 – 2019-07-15 (×2): 1 via ORAL
  Filled 2019-07-13 (×2): qty 1

## 2019-07-13 MED ORDER — BENZOCAINE-MENTHOL 20-0.5 % EX AERO
1.0000 "application " | INHALATION_SPRAY | CUTANEOUS | Status: DC | PRN
  Filled 2019-07-13: qty 56

## 2019-07-13 MED ORDER — ONDANSETRON HCL 4 MG/2ML IJ SOLN: 4.0000 mg | INTRAMUSCULAR | Status: DC | PRN

## 2019-07-13 MED ORDER — OXYTOCIN 40 UNITS IN NORMAL SALINE INFUSION - SIMPLE MED
1.0000 m[IU]/min | INTRAVENOUS | Status: DC
  Administered 2019-07-13: 2 m[IU]/min via INTRAVENOUS

## 2019-07-13 MED ORDER — ACETAMINOPHEN 325 MG PO TABS
650.0000 mg | ORAL_TABLET | ORAL | Status: DC | PRN
  Administered 2019-07-14 – 2019-07-15 (×4): 650 mg via ORAL
  Filled 2019-07-13 (×4): qty 2

## 2019-07-13 MED ORDER — DIPHENHYDRAMINE HCL 25 MG PO CAPS: 25.0000 mg | ORAL_CAPSULE | Freq: Four times a day (QID) | ORAL | Status: DC | PRN

## 2019-07-13 MED ORDER — OXYCODONE HCL 5 MG PO TABS: 10.0000 mg | ORAL_TABLET | ORAL | Status: DC | PRN

## 2019-07-13 MED ORDER — ONDANSETRON HCL 4 MG PO TABS: 4.0000 mg | ORAL_TABLET | ORAL | Status: DC | PRN

## 2019-07-13 MED ORDER — OXYCODONE HCL 5 MG PO TABS
5.0000 mg | ORAL_TABLET | ORAL | Status: DC | PRN
  Administered 2019-07-14 – 2019-07-15 (×6): 5 mg via ORAL
  Filled 2019-07-13 (×6): qty 1

## 2019-07-13 NOTE — Progress Notes (Signed)
With assist of second RN at bedside, Epifanio Lesches, RN, attempted to move patient from bed to wheelchair with stedy.  Patient unable to pull to standing on stedy with multiple attempts. Patient repositioned back to bed.  Able to left hips off of bed and move legs up and down in bed. Will continue to assess movement. Called Tish Men, mother baby RN, to report our delay in moving to mother baby.

## 2019-07-13 NOTE — Progress Notes (Signed)
I was asked by Dr. Pamala Hurry to assess patient for AROM S:  Pt. Feeling some sharp lower abdominal pain with contractions and occasional pressure; discussed AROM and pt. Agrees   O: On Pitocin at 6 milliunits VS: Blood pressure 132/87, pulse 76, temperature 97.9 F (36.6 C), temperature source Oral, resp. rate 16, height 5\' 6"  (1.676 m), weight 122.2 kg, SpO2 98 %.        FHR : baseline 120 bpm / variability moderate / accelerations +15x15 / no decelerations        Toco: contractions every 2-3 minutes / moderate         Cervix : Dilation: 5.5 Effacement (%): 70 Cervical Position: Middle Station: -2 Presentation: Vertex Exam by:: Lars Pinks, CNM        Membranes: AROM - clear fluid   A: Latent labor     FHR category 1     GBS negative   P: Epidural PCA PRN     Pitocin augmentation PRN     Dr. Pamala Hurry updated with exam and will resume care    Lars Pinks, MSN, CNM [facility]

## 2019-07-13 NOTE — Progress Notes (Signed)
Patient moved to mother baby. Used stedy in labor and delivery room to move to wheelchair.  Used stedy to move from wheelchair to bed in mother baby. Patient c/o "weakness" in left leg. Able to move self up in bed and move legs up and down in bed.

## 2019-07-13 NOTE — Lactation Note (Signed)
This note was copied from a baby's chart. Lactation Consultation Note  Patient Name: Helen Yates [id number] Date: 07/13/2019 Reason for consult: Early term 37-38.6wks;1st time breastfeeding  P2 mother whose infant is now 48 hours old.  This is an ETI at 38+1 weeks.  Mother attempted to breast feed her first child (now 31 years old) but the baby would not latch.  Mother pumped and bottle fed for 6 months.  Baby was asleep in the bassinet when I arrived.  Mother stated he has latched "right on" since delivery and was very happy.  Encouraged her to feed 8-12 times/24 hours or sooner if he shows feeding cues.  Reviewed cues.  Explained the importance of doing hand expression before/after feedings to help with milk supply.  Mother is familiar with hand expression and did not wish to practice at this time.  Colostrum container provided for any EBM she obtains.  Milk storage times reviewed and finger feeding demonstrated.  Mom made aware of O/P services, breastfeeding support groups, community resources, and our phone # for post-discharge questions. Mother has a DEBP for home use.  She will be returning to work after 12 weeks.  Answered questions regarding pumping and how to allow father to help feed baby.  Encouraged mother to call for latch assistance as needed.  Informed her that she can also ask her RN for help and we will be a support system for her in the hospital to make this experience a positive one for her.  Mother dedicated to breast feeding and very appreciative of help.  Father present and supportive.   Maternal Data Formula Feeding for Exclusion: No Has patient been taught Hand Expression?: Yes Does the patient have breastfeeding experience prior to this delivery?: No(pumped and bottle fed for 6 months)  Feeding Feeding Type: Breast Fed  LATCH Score                   Interventions    Lactation Tools Discussed/Used     Consult Status Consult Status: Follow-up Date:  07/14/19 Follow-up type: In-patient    Little Ishikawa 07/13/2019, 6:38 PM

## 2019-07-14 LAB — COMPREHENSIVE METABOLIC PANEL
ALT: 25 U/L (ref 0–44)
AST: 28 U/L (ref 15–41)
Albumin: 2 g/dL — ABNORMAL LOW (ref 3.5–5.0)
Alkaline Phosphatase: 112 U/L (ref 38–126)
Anion gap: 8 (ref 5–15)
BUN: 5 mg/dL — ABNORMAL LOW (ref 6–20)
CO2: 21 mmol/L — ABNORMAL LOW (ref 22–32)
Calcium: 8.3 mg/dL — ABNORMAL LOW (ref 8.9–10.3)
Chloride: 109 mmol/L (ref 98–111)
Creatinine, Ser: 0.7 mg/dL (ref 0.44–1.00)
GFR calc Af Amer: >60 mL/min (ref >60)
GFR calc non Af Amer: >60 mL/min (ref >60)
Glucose, Bld: 127 mg/dL — ABNORMAL HIGH (ref 70–99)
Potassium: 3.7 mmol/L (ref 3.5–5.1)
Sodium: 138 mmol/L (ref 135–145)
Total Bilirubin: 0.3 mg/dL (ref 0.3–1.2)
Total Protein: 4.8 g/dL — ABNORMAL LOW (ref 6.5–8.1)

## 2019-07-14 LAB — CBC
HCT: 24.3 % — ABNORMAL LOW (ref 36.0–46.0)
Hemoglobin: 8.6 g/dL — ABNORMAL LOW (ref 12.0–15.0)
MCH: 32.8 pg (ref 26.0–34.0)
MCHC: 35.4 g/dL (ref 30.0–36.0)
MCV: 92.7 fL (ref 80.0–100.0)
Platelets: 182 10*3/uL (ref 150–400)
RBC: 2.62 MIL/uL — ABNORMAL LOW (ref 3.87–5.11)
RDW: 14.1 % (ref 11.5–15.5)
WBC: 7.9 10*3/uL (ref 4.0–10.5)
nRBC: 0 % (ref 0.0–0.2)

## 2019-07-14 MED ORDER — POLYSACCHARIDE IRON COMPLEX 150 MG PO CAPS
150.0000 mg | ORAL_CAPSULE | Freq: Every day | ORAL | Status: DC
  Administered 2019-07-15: 09:00:00 150 mg via ORAL
  Filled 2019-07-14: qty 1

## 2019-07-14 MED ORDER — SERTRALINE HCL 50 MG PO TABS
150.0000 mg | ORAL_TABLET | Freq: Every day | ORAL | Status: DC
  Administered 2019-07-15: 150 mg via ORAL
  Filled 2019-07-14: qty 1

## 2019-07-14 MED ORDER — MAGNESIUM OXIDE 400 (241.3 MG) MG PO TABS
400.0000 mg | ORAL_TABLET | Freq: Every day | ORAL | Status: DC
  Administered 2019-07-15: 09:00:00 400 mg via ORAL
  Filled 2019-07-14: qty 1

## 2019-07-14 NOTE — Progress Notes (Addendum)
PPD #1, SVD, 2nd degree repair, baby boy "Landon"  S:  Reports feeling tired; c/o pain from cramping especially with breastfeeding - some relief with Motrin, Tylenol, and Oxy IR  Denies HA, visual changes, RUQ/epigastric pain              Tolerating po/ No nausea or vomiting / Denies dizziness or SOB             Bleeding is moderate             Up ad lib / ambulatory / voiding QS  Newborn breast feeding - going well  / Circumcision - planning  O:               VS: BP (!) 139/93 (BP Location: Right Arm)   Pulse 67   Temp 98.1 F (36.7 C) (Oral)   Resp 18   Ht 5\' 6"  (1.676 m)   Wt 122.2 kg   SpO2 99%   Breastfeeding Unknown   BMI 43.50 kg/m  07/14/19 0549  98.1 F (36.7 C)  67  -  18  139/93Abnormal   Sitting  99 %  -  - DW   07/14/19 0240  98.2 F (36.8 C)  73  -  16  101/77  -  99 %  -  - DW   07/13/19 2230  98.4 F (36.9 C)  91  -  18  119/86  Semi-fowlers  100 %  -  - DW   07/13/19 1815  98.4 F (36.9 C)  111Abnormal   -  17  120/53Abnormal   Semi-fowlers  -  -  - ER   07/13/19 1709  98 F (36.7 C)  115Abnormal   -  18  119/66  Sitting  100 %  -  - ER   07/13/19 1445  -  91  -  16  140/86  -  -  -  - SJ   07/13/19 1430  -  91  -  16  131/79  -  -  -  - SJ   07/13/19 1420  -  89  -  16  127/74  -  -  -  - SJ   07/13/19 1401  -  91  -  16  137/77  -  -  -  - SJ      LABS:              Recent Labs    07/12/19 2145  WBC 8.9  HGB 11.5*  PLT 209               Blood type: --/--/B POS, B POS Performed at [facility], [place] [address], [place], [place] [address]  [phone])  Rubella: Immune (03/30 0000)                     I&O: Intake/Output      10/12 0701 - 10/13 0700 10/13 0701 - 10/14 0700   Other     Total Intake(mL/kg)     Urine (mL/kg/hr) 525 (0.2)    Blood 500    Total Output 1025    Net -1025                       Physical Exam:             Alert and oriented X3  Lungs: Clear and unlabored  Heart: regular rate and rhythm /  no  murmurs  Abdomen: soft, non-tender, non-distended, obese             Fundus: firm, non-tender, U-2  Perineum: well approximated 2nd degree perineal, mild edema  Lochia: small rubra on pad   Extremities: no edema, no calf pain or tenderness    A:  PPD # 1, SVD  2nd degree repair    Transient elevated BPs    - Labs WNL on admission   - No neural s/s or evidence of PEC   - Pt. Reports mild range BP this AM was pain related    - Mild range BP this afternoon, plan for repeat CBC and CMP  Hx. Of PP Depression/anxiety/OCD   - stable on Zoloft 100mg  daily    - Has f/u visit with her psych NP in 6 weeks   Doing well - stable status  Routine post partum orders  Encouraged to rest when baby rests  See lactation today  Circ prior to d/c   Plan for d/c home tomorrow   , MSN, CNM [facility]

## 2019-07-14 NOTE — Anesthesia Postprocedure Evaluation (Signed)
Anesthesia Post Note  Patient: Helen Yates  Procedure(s) Performed: AN AD [place]     Patient location during evaluation: Mother Baby Anesthesia Type: Epidural Level of consciousness: awake and alert Pain management: pain level controlled Vital Signs Assessment: post-procedure vital signs reviewed and stable Respiratory status: spontaneous breathing, nonlabored ventilation and respiratory function stable Cardiovascular status: stable Postop Assessment: no headache, no backache and epidural receding Anesthetic complications: no    Last Vitals:  Vitals:   07/14/19 0240 07/14/19 0549  BP: 101/77 (!) 139/93  Pulse: 73 67  Resp: 16 18  Temp: 36.8 C 36.7 C  SpO2: 99% 99%    Last Pain:  Vitals:   07/14/19 0640  TempSrc:   PainSc: 0-No pain   Pain Goal:                   Drucie Opitz

## 2019-07-14 NOTE — Progress Notes (Signed)
MOB was referred for history of depression.  * Referral screened out by Clinical Social Worker because none of the following criteria appear to apply:  ~ History of anxiety/depression during this pregnancy, or of post-partum depression following prior delivery. ~ Diagnosis of anxiety and/or depression within last 3 years OR * MOB's symptoms currently being treated with medication and/or therapy. MOB currently taking Zoloft, no concerns noted in [facility] records.   Please contact the Clinical Social Worker if needs arise, by MOB request, or if MOB scores greater than 9/yes to question 10 on [place] Postpartum Depression Screen.  Sonnie Bias, LCSWA  [facility] [phone]  

## 2019-07-15 DIAGNOSIS — O9902 Anemia complicating childbirth: Secondary | ICD-10-CM | POA: Diagnosis present

## 2019-07-15 MED ORDER — IBUPROFEN 600 MG PO TABS: 600.0000 mg | ORAL_TABLET | Freq: Four times a day (QID) | ORAL | 0 refills | Status: DC

## 2019-07-15 MED ORDER — BENZOCAINE-MENTHOL 20-0.5 % EX AERO: 1.0000 "application " | INHALATION_SPRAY | CUTANEOUS | Status: DC | PRN

## 2019-07-15 MED ORDER — MAGNESIUM OXIDE 400 (241.3 MG) MG PO TABS: 400.0000 mg | ORAL_TABLET | Freq: Every day | ORAL | Status: DC

## 2019-07-15 MED ORDER — POLYSACCHARIDE IRON COMPLEX 150 MG PO CAPS: 150.0000 mg | ORAL_CAPSULE | Freq: Every day | ORAL | 3 refills | Status: DC

## 2019-07-15 MED ORDER — COCONUT OIL OIL: 1.0000 "application " | TOPICAL_OIL | 0 refills | Status: DC | PRN

## 2019-07-15 NOTE — Lactation Note (Signed)
This note was copied from a baby's chart. Lactation Consultation Note  Patient Name: Helen Yates WGNFA'O Date: 07/15/2019 Reason for consult: Follow-up assessment;Difficult latch;Nipple pain/trauma;Term  LC in to visit with P2 Mom of ET baby on day of discharge.  Baby 15 hrs old and at 7.6% weight loss with good output.  Mom states baby has trouble latching onto breast and her nipples are sore.  Both breasts assessed.  Breasts large and heavy, with semi-flat/short nipple shafts.  Both nipples have positional stripes and some blistering on tips.    Watched as Mom was trying to latch baby without pillow support.  Parents are packed up and ready to leave.  Baby dressed and noted to pop on and off the breast, with nipple pinched when baby comes off.  Mom's hand pump packed up.   Initiated a 20 mm nipple shield, with instructions on how to properly apply it.  Nipple pulled into shield about 1/3 of the way, Mom return demonstrated how to apply.  Baby able to attain a deep latch after a couple attempts and moving from one breast to the next.  Showed FOB how to do a gentle chin tug if lower lip tucked in.  After second breast, baby able to attain a deep areolar grasp to breast with regular swallows identified.  Mom denied feeling any pain.  Mom has a Medela DEBP at home.    Instructed Mom to-  1- Keep baby STS as much as possible 2- Offer breast with cues, often >8 times per 24 hrs 3-Pre-pump using hand pump and apply nipple shield 4- Pump both breasts after every other feeding and/or hand express into spoon.  Feed baby EBM by curved tip syringe into nipple shield or spoon. 5- Follow-up with OP lactation appointment (request sent to clinic)  Comfort gels between feedings   Feeding Feeding Type: Breast Fed  LATCH Score Latch: Grasps breast easily, tongue down, lips flanged, rhythmical sucking.  Audible Swallowing: Spontaneous and intermittent  Type of Nipple: Everted at rest and  after stimulation(short nipple shafts)  Comfort (Breast/Nipple): Filling, red/small blisters or bruises, mild/mod discomfort  Hold (Positioning): Assistance needed to correctly position infant at breast and maintain latch.  LATCH Score: 8  Interventions Interventions: Breast feeding basics reviewed;Assisted with latch;Skin to skin;Breast massage;Hand express;Pre-pump if needed;Breast compression;Adjust position;Support pillows;Position options;Expressed milk;Hand pump  Lactation Tools Discussed/Used Tools: Nipple [place] Fuel;Pump Nipple shield size: 20 Breast pump type: Manual   Consult Status Consult Status: Complete Date: 07/15/19 Follow-up type: [place], [name] 07/15/2019, 1:15 PM

## 2019-07-15 NOTE — Discharge Summary (Signed)
Obstetric Discharge Summary Reason for Admission: onset of labor, Femara pregnancy, deopresion on Zoloft, chronic headaches, hyperemesis, obesity Prenatal Procedures: ultrasound Intrapartum Procedures: spontaneous vaginal delivery and epidural Postpartum Procedures: none Complications-Operative and Postpartum: 2nd degree perineal laceration Hemoglobin  Date Value Ref Range Status  07/14/2019 8.6 (L) 12.0 - 15.0 g/dL Final    Comment:    REPEATED TO VERIFY   Hemoglobin, fingerstick  Date Value Ref Range Status  07/07/2013 14.8 12.0 - 16.0 g/dL Final   HCT  Date Value Ref Range Status  07/14/2019 24.3 (L) 36.0 - 46.0 % Final    Physical Exam:  General: alert, cooperative and no distress Lochia: appropriate Uterine Fundus: firm Incision: healing well, no significant drainage DVT Evaluation: No cords or calf tenderness. Calf/Ankle edema is present.  Discharge Diagnoses: Principal Problem:   Postpartum care following vaginal delivery (10/12) Active Problems:   Pregnancy   SVD (spontaneous vaginal delivery)   Second degree perineal laceration   Maternal anemia, with delivery    Discharge Information: Date: 07/15/2019 Activity: pelvic rest Diet: routine Medications:  Allergies as of 07/15/2019      Reactions   Depo-provera [medroxyprogesterone Acetate] Rash      Medication List    STOP taking these medications   oxyCODONE-acetaminophen 5-325 MG tablet Commonly known as: Roxicet   traMADol 50 MG tablet Commonly known as: ULTRAM     TAKE these medications   acetaminophen 500 MG tablet Commonly known as: TYLENOL Take 500 mg by mouth every 6 (six) hours as needed for mild pain or headache.   benzocaine-Menthol 20-0.5 % Aero Commonly known as: DERMOPLAST Apply 1 application topically as needed for irritation (perineal discomfort).   butalbital-acetaminophen-caffeine 50-325-40 MG tablet Commonly known as: FIORICET Take 1-2 tablets by mouth 3 (three) times  daily as needed for headache.   calcium carbonate 500 MG chewable tablet Commonly known as: TUMS - dosed in mg elemental calcium Chew 2 tablets by mouth as needed for indigestion or heartburn.   coconut oil Oil Apply 1 application topically as needed.   famotidine 10 MG tablet Commonly known as: PEPCID Take 10 mg by mouth 2 (two) times daily. Patient unsure of dosage   ibuprofen 600 MG tablet Commonly known as: ADVIL Take 1 tablet (600 mg total) by mouth every 6 (six) hours.   iron polysaccharides 150 MG capsule Commonly known as: NIFEREX Take 1 capsule (150 mg total) by mouth daily. Start taking on: July 16, 2019   magnesium oxide 400 (241.3 Mg) MG tablet Commonly known as: MAG-OX Take 1 tablet (400 mg total) by mouth daily. Start taking on: July 16, 2019   ondansetron 8 MG disintegrating tablet Commonly known as: ZOFRAN-ODT Take 8 mg by mouth every 8 (eight) hours as needed for nausea or vomiting. Patient unsure of dosage   prenatal multivitamin Tabs tablet Take 1 tablet by mouth daily at 12 noon.   promethazine 25 MG tablet Commonly known as: PHENERGAN Take 25 mg by mouth 4 (four) times daily as needed for nausea/vomiting.   sertraline 100 MG tablet Commonly known as: ZOLOFT Take 150 mg by mouth daily.            Discharge Care Instructions  (From admission, onward)         Start     Ordered   07/15/19 0000  Discharge wound care:    Comments: Sitz baths 2 times /day with warm water x 1 week. May add herbals: 1 ounce dried comfrey leaf* 1 ounce calendula  flowers 1 ounce lavender flowers 1/2 ounce dried uva ursi leaves 1/2 ounce witch hazel blossoms (if you can find them) 1/2 ounce dried sage leaf 1/2 cup sea salt Directions: Bring 2 quarts of water to a boil. Turn off heat, and place 1 ounce (approximately 1 large handful) of the above mixed herbs (not the salt) into the pot. Steep, covered, for 30 minutes.  Strain the liquid well with a fine  mesh strainer, and discard the herb material. Add 2 quarts of liquid to the tub, along with the 1/2 cup of salt. This medicinal liquid can also be made into compresses and peri-rinses.   07/15/19 0958         Condition: stable Instructions: refer to practice specific booklet Discharge to: home Follow-up Information    Obgyn, [facility]. Schedule an appointment as soon as possible for a visit in 6 week(s).   Contact information: [place] [address] [phone]           Newborn Data: Live born female  Birth Weight: 7 lb 10 oz (3459 g) APGAR: 39, 9  Newborn Delivery   Birth date/time: 07/13/2019 13:24:00 Delivery type: Vaginal, Spontaneous      Home with mother.  Juliene Pina, CNM 07/15/2019, 1:24 PM

## 2019-07-18 ENCOUNTER — Other Ambulatory Visit (HOSPITAL_COMMUNITY): Admission: RE | Admit: 2019-07-18 | Payer: BC Managed Care – PPO | Source: Ambulatory Visit

## 2019-07-20 ENCOUNTER — Inpatient Hospital Stay (HOSPITAL_COMMUNITY): Payer: BC Managed Care – PPO

## 2019-07-20 ENCOUNTER — Inpatient Hospital Stay (HOSPITAL_COMMUNITY): Admission: AD | Admit: 2019-07-20 | Payer: BC Managed Care – PPO | Source: Home / Self Care | Admitting: Obstetrics

## 2021-05-24 ENCOUNTER — Ambulatory Visit: Payer: BC Managed Care – PPO | Admitting: Allergy & Immunology

## 2021-05-24 ENCOUNTER — Telehealth: Payer: Self-pay

## 2021-05-24 ENCOUNTER — Other Ambulatory Visit: Payer: Self-pay

## 2021-05-24 ENCOUNTER — Encounter: Payer: Self-pay | Admitting: Allergy & Immunology

## 2021-05-24 VITALS — BP 120/74 | HR 72 | Temp 97.6°F | Resp 18 | Ht 66.0 in | Wt 303.6 lb

## 2021-05-24 DIAGNOSIS — J302 Other seasonal allergic rhinitis: Secondary | ICD-10-CM | POA: Diagnosis not present

## 2021-05-24 DIAGNOSIS — J3089 Other allergic rhinitis: Secondary | ICD-10-CM

## 2021-05-24 MED ORDER — RYVENT 6 MG PO TABS: ORAL_TABLET | ORAL | 1 refills | Status: AC

## 2021-05-24 MED ORDER — BEPOTASTINE BESILATE 1.5 % OP SOLN: 1.0000 [drp] | Freq: Two times a day (BID) | OPHTHALMIC | 1 refills | Status: AC | PRN

## 2021-05-24 NOTE — Patient Instructions (Addendum)
1. Seasonal and perennial allergic rhinitis - Testing today showed: grasses, ragweed, trees, indoor molds, outdoor molds, dust mites, and cat - Copy of test results provided.  - Avoidance measures provided. - Continue with: Singulair (montelukast) 10mg  daily - Start taking: Ryvent (carbinoxamine) 6mg  tablet 3-4 times daily as needed and Bepreve (bepotastine) one drop per eye twice daily as needed) - Ryvent can cause sleepiness, so beware of that.  - You can use an extra dose of the antihistamine, if needed, for breakthrough symptoms.  - Consider nasal saline rinses 1-2 times daily to remove allergens from the nasal cavities as well as help with mucous clearance (this is especially helpful to do before the nasal sprays are given) - Consider allergy shots as a means of long-term control. - Allergy shots "re-train" and "reset" the immune system to ignore environmental allergens and decrease the resulting immune response to those allergens (sneezing, itchy watery eyes, runny nose, nasal congestion, etc).    - Allergy shots improve symptoms in 75-85% of patients.  - We can discuss more at the next appointment if the medications are not working for you.  2. Return in about 6 weeks (around 07/05/2021).    Please inform 12-19-1993 of any Emergency Department visits, hospitalizations, or changes in symptoms. Call 09/04/2021 before going to the ED for breathing or allergy symptoms since we might be able to fit you in for a sick visit. Feel free to contact us anytime with any questions, problems, or concerns.  It was a pleasure to meet you today!  Websites that have reliable patient information: 1. American Academy of Asthma, Allergy, and Immunology: www.aaaai.org 2. Food Allergy Research and Education (FARE): foodallergy.org 3. Mothers of Asthmatics: http://www.asthmacommunitynetwork.org 4. American College of Allergy, Asthma, and Immunology: www.acaai.org   COVID-19 Vaccine Information can be found at:  [place] For questions related to vaccine distribution or appointments, please email [email or web address] or call [phone].   We realize that you might be concerned about having an allergic reaction to the COVID19 vaccines. To help with that concern, WE ARE OFFERING THE COVID19 VACCINES IN OUR OFFICE! Ask the front desk for dates!     "Like" [email or web address] on Facebook and Instagram for our latest updates!      A healthy democracy works best when [phone] participate! Make sure you are registered to vote! If you have moved or changed any of your contact information, you will need to get this updated before voting!  In some cases, you MAY be able to register to vote online: [place]   Control Negative   [employer] Negative   Johnson 1+   7 Grass Negative   Ragweed mix 1+   Weed mix Negative   Tree mix 2+   Mold 1 2+   Mold 2 2+   Mold 3 2+   Mold 4 2+   Cat 2+   Dog Negative   Cockroach Negative   Mite mix 1+     Reducing Pollen Exposure  The American Academy of Allergy, Asthma and Immunology suggests the following steps to reduce your exposure to pollen during allergy seasons.    Do not hang sheets or clothing out to dry; pollen may collect on these items. Do not mow lawns or spend time around freshly cut grass; mowing stirs up pollen. Keep windows closed at night.  Keep car windows closed while driving. Minimize morning activities outdoors, a time when pollen counts are usually at their highest. Stay indoors as much as possible when pollen counts  or humidity is high and on windy days when pollen tends to remain in the air longer. Use air conditioning when possible.  Many air conditioners have filters that trap the pollen spores. Use a HEPA room air filter to remove pollen form the indoor air you breathe.  Control of Mold Allergen   Mold and fungi can grow on a variety of  surfaces provided certain temperature and moisture conditions exist.  Outdoor molds grow on plants, decaying vegetation and soil.  The major outdoor mold, Alternaria and Cladosporium, are found in very high numbers during hot and dry conditions.  Generally, a late Summer - Fall peak is seen for common outdoor fungal spores.  Rain will temporarily lower outdoor mold spore count, but counts rise rapidly when the rainy period ends.  The most important indoor molds are Aspergillus and Penicillium.  Dark, humid and poorly ventilated basements are ideal sites for mold growth.  The next most common sites of mold growth are the bathroom and the kitchen.  Outdoor (Seasonal) Mold Control  Positive outdoor molds via skin testing: Alternaria, Cladosporium, Bipolaris (Helminthsporium), Drechslera (Curvalaria), and Mucor  Use air conditioning and keep windows closed Avoid exposure to decaying vegetation. Avoid leaf raking. Avoid grain handling. Consider wearing a face mask if working in moldy areas.    Indoor (Perennial) Mold Control   Positive indoor molds via skin testing: Aspergillus, Penicillium, Fusarium, Aureobasidium (Pullulara), and Rhizopus  Maintain humidity below 50%. Clean washable surfaces with 5% bleach solution. Remove sources e.g. contaminated carpets.    Control of Dust Mite Allergen    Dust mites play a major role in allergic asthma and rhinitis.  They occur in environments with high humidity wherever human skin is found.  Dust mites absorb humidity from the atmosphere (ie, they do not drink) and feed on organic matter (including shed human and animal skin).  Dust mites are a microscopic type of insect that you cannot see with the naked eye.  High levels of dust mites have been detected from mattresses, pillows, carpets, upholstered furniture, bed covers, clothes, soft toys and any woven material.  The principal allergen of the dust mite is found in its feces.  A gram of dust may  contain 1,000 mites and 250,000 fecal particles.  Mite antigen is easily measured in the air during house cleaning activities.  Dust mites do not bite and do not cause harm to humans, other than by triggering allergies/asthma.    Ways to decrease your exposure to dust mites in your home:  Encase mattresses, box springs and pillows with a mite-impermeable barrier or cover   Wash sheets, blankets and drapes weekly in hot water (130 F) with detergent and dry them in a dryer on the hot setting.  Have the room cleaned frequently with a vacuum cleaner and a damp dust-mop.  For carpeting or rugs, vacuuming with a vacuum cleaner equipped with a high-efficiency particulate air (HEPA) filter.  The dust mite allergic individual should not be in a room which is being cleaned and should wait 1 hour after cleaning before going into the room. Do not sleep on upholstered furniture (eg, couches).   If possible removing carpeting, upholstered furniture and drapery from the home is ideal.  Horizontal blinds should be eliminated in the rooms where the person spends the most time (bedroom, study, television room).  Washable vinyl, roller-type shades are optimal. Remove all non-washable stuffed toys from the bedroom.  Wash stuffed toys weekly like sheets and blankets above.  Reduce indoor humidity to less than 50%.  Inexpensive humidity monitors can be purchased at most hardware stores.  Do not use a humidifier as can make the problem worse and are not recommended.  Control of Dog or Cat Allergen  Avoidance is the best way to manage a dog or cat allergy. If you have a dog or cat and are allergic to dog or cats, consider removing the dog or cat from the home. If you have a dog or cat but don't want to find it a new home, or if your family wants a pet even though someone in the household is allergic, here are some strategies that may help keep symptoms at bay:  Keep the pet out of your bedroom and restrict it to only a  few rooms. Be advised that keeping the dog or cat in only one room will not limit the allergens to that room. Don't pet, hug or kiss the dog or cat; if you do, wash your hands with soap and water. High-efficiency particulate air (HEPA) cleaners run continuously in a bedroom or living room can reduce allergen levels over time. Regular use of a high-efficiency vacuum cleaner or a central vacuum can reduce allergen levels. Giving your dog or cat a bath at least once a week can reduce airborne allergen.  Allergy Shots   Allergies are the result of a chain reaction that starts in the immune system. Your immune system controls how your body defends itself. For instance, if you have an allergy to pollen, your immune system identifies pollen as an invader or allergen. Your immune system overreacts by producing antibodies called Immunoglobulin E (IgE). These antibodies travel to cells that release chemicals, causing an allergic reaction.  The concept behind allergy immunotherapy, whether it is received in the form of shots or tablets, is that the immune system can be desensitized to specific allergens that trigger allergy symptoms. Although it requires time and patience, the payback can be long-term relief.  How Do Allergy Shots Work?  Allergy shots work much like a vaccine. Your body responds to injected amounts of a particular allergen given in increasing doses, eventually developing a resistance and tolerance to it. Allergy shots can lead to decreased, minimal or no allergy symptoms.  There generally are two phases: build-up and maintenance. Build-up often ranges from three to six months and involves receiving injections with increasing amounts of the allergens. The shots are typically given once or twice a week, though more rapid build-up schedules are sometimes used.  The maintenance phase begins when the most effective dose is reached. This dose is different for each person, depending on how allergic  you are and your response to the build-up injections. Once the maintenance dose is reached, there are longer periods between injections, typically two to four weeks.  Occasionally doctors give cortisone-type shots that can temporarily reduce allergy symptoms. These types of shots are different and should not be confused with allergy immunotherapy shots.  Who Can Be Treated with Allergy Shots?  Allergy shots may be a good treatment approach for people with allergic rhinitis (hay fever), allergic asthma, conjunctivitis (eye allergy) or stinging insect allergy.   Before deciding to begin allergy shots, you should consider:   The length of allergy season and the severity of your symptoms  Whether medications and/or changes to your environment can control your symptoms  Your desire to avoid long-term medication use  Time: allergy immunotherapy requires a major time commitment  Cost: may vary depending on  your insurance coverage  Allergy shots for children age 39 and older are effective and often well tolerated. They might prevent the onset of new allergen sensitivities or the progression to asthma.  Allergy shots are not started on patients who are pregnant but can be continued on patients who become pregnant while receiving them. In some patients with other medical conditions or who take certain common medications, allergy shots may be of risk. It is important to mention other medications you talk to your allergist.   When Will I Feel Better?  Some may experience decreased allergy symptoms during the build-up phase. For others, it may take as long as 12 months on the maintenance dose. If there is no improvement after a year of maintenance, your allergist will discuss other treatment options with you.  If you aren't responding to allergy shots, it may be because there is not enough dose of the allergen in your vaccine or there are missing allergens that were not identified during your allergy  testing. Other reasons could be that there are high levels of the allergen in your environment or major exposure to non-allergic triggers like tobacco smoke.  What Is the Length of Treatment?  Once the maintenance dose is reached, allergy shots are generally continued for three to five years. The decision to stop should be discussed with your allergist at that time. Some people may experience a permanent reduction of allergy symptoms. Others may relapse and a longer course of allergy shots can be considered.  What Are the Possible Reactions?  The two types of adverse reactions that can occur with allergy shots are local and systemic. Common local reactions include very mild redness and swelling at the injection site, which can happen immediately or several hours after. A systemic reaction, which is less common, affects the entire body or a particular body system. They are usually mild and typically respond quickly to medications. Signs include increased allergy symptoms such as sneezing, a stuffy nose or hives.  Rarely, a serious systemic reaction called anaphylaxis can develop. Symptoms include swelling in the throat, wheezing, a feeling of tightness in the chest, nausea or dizziness. Most serious systemic reactions develop within 30 minutes of allergy shots. This is why it is strongly recommended you wait in your doctor's office for 30 minutes after your injections. Your allergist is trained to watch for reactions, and his or her staff is trained and equipped with the proper medications to identify and treat them.  Who Should Administer Allergy Shots?  The preferred location for receiving shots is your prescribing allergist's office. Injections can sometimes be given at another facility where the physician and staff are trained to recognize and treat reactions, and have received instructions by your prescribing allergist.

## 2021-05-24 NOTE — Telephone Encounter (Signed)
Patient came in today for a visit and she is requesting a itemized statement showing her copayment today.

## 2021-05-24 NOTE — Progress Notes (Signed)
NEW PATIENT  Date of Service/Encounter:  05/24/21  Consult requested by: Patient, No Pcp Per (Inactive)   Assessment:   Seasonal and perennial allergic rhinitis (grasses, ragweed, trees, indoor molds, outdoor molds, dust mites, and cat)  Plan/Recommendations:    1. Seasonal and perennial allergic rhinitis - Testing today showed: grasses, ragweed, trees, indoor molds, outdoor molds, dust mites, and cat - Copy of test results provided.  - Avoidance measures provided. - Continue with: Singulair (montelukast) 10mg  daily - Start taking: Ryvent (carbinoxamine) 6mg  tablet 3-4 times daily as needed and Bepreve (bepotastine) one drop per eye twice daily as needed) - Ryvent can cause sleepiness, so beware of that.  - You can use an extra dose of the antihistamine, if needed, for breakthrough symptoms.  - Consider nasal saline rinses 1-2 times daily to remove allergens from the nasal cavities as well as help with mucous clearance (this is especially helpful to do before the nasal sprays are given) - Consider allergy shots as a means of long-term control. - Allergy shots "re-train" and "reset" the immune system to ignore environmental allergens and decrease the resulting immune response to those allergens (sneezing, itchy watery eyes, runny nose, nasal congestion, etc).    - Allergy shots improve symptoms in 75-85% of patients.  - We can discuss more at the next appointment if the medications are not working for you.  2. Return in about 6 weeks (around 07/05/2021).    This note in its entirety was forwarded to the Provider who requested this consultation.  Subjective:   Helen Yates is a 35 y.o. female presenting today for evaluation of  Chief Complaint  Patient presents with   Allergic Reaction    Has been having stomach issues not sure the cause    Allergy Testing    Off of all antihistamines    Allergic Rhinitis     She was on singular helped a little. She has tried zyrtec and  Helen Yates. Still has watery eyes and sneezing, some congestion     Helen Yates has a history of the following: Patient Active Problem List   Diagnosis Date Noted   Maternal anemia, with delivery 07/15/2019   SVD (spontaneous vaginal delivery) 07/13/2019   Second degree perineal laceration 07/13/2019   Pregnancy 07/12/2019   Postpartum care following vaginal delivery (10/12) 10/14/2014   Vitamin D deficiency 05/27/2012   Migraine with aura 03/16/2012    History obtained from: chart review and patient.  Helen Yates was referred by Patient, No Pcp Per (Inactive).     Helen Yates is a 35 y.o. female presenting for an evaluation of allergies .    Asthma/Respiratory Symptom History: She had asthma when she was younger but she does not have problems now.   She has no inhaler now.   Allergic Rhinitis Symptom History: She is in eye drops every day. She had Singulair two months ago. She has been on Zyrtec and Allegra.  Symptoms started when she was a child. She had a reaction to cats at one point and she had asthma for a year. This helped everything when they got rid of the cat. She denies any reactions to dogs; she has had them previously. Sometimes they do bother her but it is not as bad as the cats. She grew up in this area. Her brother has bad allergies as well.  Food Allergy Symptom History: She said that her husband recommended food testing. She has been having stomach issues. This is mostly diarrhea. This has  been going on for a couple of months. It is not after a particular food.  She denies hives or mouth itching. She did have reflux when she was pregnant and she took Pepcid. She has not tried Pepcid with this current cough.   Of note she did have her gallbladder removed in 2016 at the age of 32. She had some diarrhea after that, but it improved.   Otherwise, there is no history of other atopic diseases, including drug allergies, stinging insect allergies, eczema, urticaria, or contact  dermatitis. There is no significant infectious history. Vaccinations are up to date.    Past Medical History: Patient Active Problem List   Diagnosis Date Noted   Maternal anemia, with delivery 07/15/2019   SVD (spontaneous vaginal delivery) 07/13/2019   Second degree perineal laceration 07/13/2019   Pregnancy 07/12/2019   Postpartum care following vaginal delivery (10/12) 10/14/2014   Vitamin D deficiency 05/27/2012   Migraine with aura 03/16/2012    Medication List:  Allergies as of 05/24/2021       Reactions   Depo-provera [medroxyprogesterone Acetate] Rash        Medication List        Accurate as of May 24, 2021 12:22 PM. If you have any questions, ask your nurse or doctor.          acetaminophen 500 MG tablet Commonly known as: TYLENOL Take 500 mg by mouth every 6 (six) hours as needed for mild pain or headache.   ALPRAZolam 0.25 MG tablet Commonly known as: XANAX Take 0.25 mg by mouth daily as needed.   benzocaine-Menthol 20-0.5 % Aero Commonly known as: DERMOPLAST Apply 1 application topically as needed for irritation (perineal discomfort).   butalbital-acetaminophen-caffeine 50-325-40 MG tablet Commonly known as: FIORICET Take 1-2 tablets by mouth 3 (three) times daily as needed for headache.   calcium carbonate 500 MG chewable tablet Commonly known as: TUMS - dosed in mg elemental calcium Chew 2 tablets by mouth as needed for indigestion or heartburn.   coconut oil Oil Apply 1 application topically as needed.   famotidine 10 MG tablet Commonly known as: PEPCID Take 10 mg by mouth 2 (two) times daily. Patient unsure of dosage   ibuprofen 600 MG tablet Commonly known as: ADVIL Take 1 tablet (600 mg total) by mouth every 6 (six) hours.   iron polysaccharides 150 MG capsule Commonly known as: NIFEREX Take 1 capsule (150 mg total) by mouth daily.   magnesium oxide 400 (241.3 Mg) MG tablet Commonly known as: MAG-OX Take 1 tablet (400 mg  total) by mouth daily.   ondansetron 8 MG disintegrating tablet Commonly known as: ZOFRAN-ODT Take 8 mg by mouth every 8 (eight) hours as needed for nausea or vomiting. Patient unsure of dosage   prenatal multivitamin Tabs tablet Take 1 tablet by mouth daily at 12 noon.   promethazine 25 MG tablet Commonly known as: PHENERGAN Take 25 mg by mouth 4 (four) times daily as needed for nausea/vomiting.   sertraline 100 MG tablet Commonly known as: ZOLOFT Take 150 mg by mouth daily.        Birth History: non-contributory  Developmental History: non-contributory  Past Surgical History: Past Surgical History:  Procedure Laterality Date   CHOLECYSTECTOMY N/A 05/31/2015   Procedure: LAPAROSCOPIC CHOLECYSTECTOMY;  Surgeon: Axel Filler, MD;  Location: [facility] OR;  Service: General;  Laterality: N/A;   WISDOM TOOTH EXTRACTION       Family History: Family History  Problem Relation Age of Onset  Fibromyalgia Mother    Osteoporosis Mother    Arthritis Mother    Asthma Mother    Depression Mother    Miscarriages / [place] Mother    Migraines Father    Hypertension Father    Arthritis Father    Heart disease Maternal Grandfather    Diabetes Maternal Grandfather    Alcohol abuse Maternal Grandfather    Arthritis Maternal Grandfather    Hyperlipidemia Maternal Grandfather    Breast cancer Paternal Grandmother        twice   Diabetes Paternal Grandmother    Cancer Paternal Grandmother    Hyperlipidemia Paternal Grandmother    Arthritis Maternal Grandmother    Heart disease Maternal Grandmother    Drug abuse Brother      Social History: Jeremie lives at home with her family. She works at [employer] in [place] [employer]. She has been there for 10 years. She has one daughter who is aged 736 and a son who is aged 2. Husband works at [employer].  She lives in a house that is 33 years old. There is carpeting in the main living areas and wooden floors in the bedrooms.  There is electric heating and central cooling. There are no animals inside or outside of the home. There are no dust mite coverings on the bedding. There is tobacco exposure in the car as well as the home with vaping.    Review of Systems  Constitutional: Negative.  Negative for chills, fever, malaise/fatigue and weight loss.  HENT: Negative.  Negative for congestion, ear discharge, ear pain, sinus pain and sore throat.   Eyes:  Negative for pain, discharge and redness.  Respiratory:  Negative for cough, sputum production, shortness of breath and wheezing.   Cardiovascular: Negative.  Negative for chest pain and palpitations.  Gastrointestinal:  Negative for abdominal pain, constipation, diarrhea, heartburn, nausea and vomiting.  Skin: Negative.  Negative for itching and rash.  Neurological:  Negative for dizziness and headaches.  Endo/Heme/Allergies:  Negative for environmental allergies. Does not bruise/bleed easily.      Objective:   Blood pressure 120/74, pulse 72, temperature 97.6 F (36.4 C), resp. rate 18, height 5\' 6"  (1.676 m), weight (!) 303 lb 9.6 oz (137.7 kg), SpO2 97 %, unknown if currently breastfeeding. Body mass index is 49 kg/m.   Physical Exam:   Physical Exam Vitals reviewed.  Constitutional:      Appearance: She is well-developed.  HENT:     Head: Normocephalic and atraumatic.     Right Ear: Tympanic membrane, ear canal and external ear normal. No drainage, swelling or tenderness. Tympanic membrane is not injected, scarred, erythematous, retracted or bulging.     Left Ear: Tympanic membrane, ear canal and external ear normal. No drainage, swelling or tenderness. Tympanic membrane is not injected, scarred, erythematous, retracted or bulging.     Nose: Mucosal edema and rhinorrhea present. No nasal deformity or septal deviation.     Right Turbinates: Enlarged, swollen and pale.     Left Turbinates: Enlarged, swollen and pale.     Right Sinus: No maxillary  sinus tenderness or frontal sinus tenderness.     Left Sinus: No maxillary sinus tenderness or frontal sinus tenderness.     Mouth/Throat:     Mouth: Mucous membranes are not pale and not dry.     Pharynx: Uvula midline.  Eyes:     General:        Right eye: No discharge.  Left eye: No discharge.     Conjunctiva/sclera: Conjunctivae normal.     Right eye: Right conjunctiva is not injected. No chemosis.    Left eye: Left conjunctiva is not injected. No chemosis.    Pupils: Pupils are equal, round, and reactive to light.  Cardiovascular:     Rate and Rhythm: Normal rate and regular rhythm.     Heart sounds: Normal heart sounds.  Pulmonary:     Effort: Pulmonary effort is normal. No tachypnea, accessory muscle usage or respiratory distress.     Breath sounds: Normal breath sounds. No wheezing, rhonchi or rales.     Comments: Moving air well in all lung fields. No increased work of breathing noted.  Chest:     Chest wall: No tenderness.  Abdominal:     Tenderness: There is no abdominal tenderness. There is no guarding or rebound.  Lymphadenopathy:     Head:     Right side of head: No submandibular, tonsillar or occipital adenopathy.     Left side of head: No submandibular, tonsillar or occipital adenopathy.     Cervical: No cervical adenopathy.  Skin:    General: Skin is warm.     Capillary Refill: Capillary refill takes less than 2 seconds.     Coloration: Skin is not pale.     Findings: No abrasion, erythema, petechiae or rash. Rash is not papular, urticarial or vesicular.     Comments: No eczematous or urticarial lesions noted.   Neurological:     Mental Status: She is alert.     Diagnostic studies:   Allergy Studies:     Airborne Adult Perc - 05/24/21 0915     Time Antigen Placed 0915    Allergen Manufacturer Waynette Buttery    Location Back    Number of Test 59    1. Control-Buffer 50% Glycerol Negative    2. Control-Histamine 1 mg/ml 3+    3. Albumin saline Negative     4. Bahia Negative    5. French Southern Territories Negative    6. Johnson Negative    7. [place] Blue Negative    8. Meadow Fescue Negative    9. Perennial Rye Negative    10. Sweet Vernal Negative    11. Timothy Negative    12. Cocklebur Negative    13. Burweed Marshelder Negative    14. Ragweed, short Negative    15. Ragweed, Giant Negative    16. Plantain,  English Negative    17. Lamb's Quarters Negative    18. Sheep Sorrell Negative    19. Rough Pigweed Negative    20. Marsh Elder, Rough Negative    21. Mugwort, Common Negative    22. Ash mix Negative    23. Birch mix Negative    24. Beech American Negative    25. Box, Elder Negative    26. Cedar, red Negative    27. Cottonwood, Guinea-Bissau Negative    28. Elm mix Negative    29. Hickory Negative    30. Maple mix Negative    31. Oak, Guinea-Bissau mix Negative    32. Pecan Pollen Negative    33. Pine mix Negative    34. Sycamore Eastern Negative    35. Walnut, Black Pollen Negative    36. Alternaria alternata Negative    37. Cladosporium Herbarum Negative    38. Aspergillus mix Negative    39. Penicillium mix Negative    40. Bipolaris sorokiniana (Helminthosporium) Negative    41. Drechslera spicifera (Curvularia)  Negative    42. Mucor plumbeus Negative    43. Fusarium moniliforme Negative    44. Aureobasidium pullulans (pullulara) Negative    45. Rhizopus oryzae Negative    46. Botrytis cinera Negative    47. Epicoccum nigrum Negative    48. Phoma betae Negative    49. Candida Albicans Negative    50. Trichophyton mentagrophytes Negative    51. Mite, D Farinae  5,000 AU/ml Negative    52. Mite, D Pteronyssinus  5,000 AU/ml Negative    53. Cat Hair 10,000 BAU/ml Negative    54.  Dog Epithelia Negative    55. Mixed Feathers Negative    56. Horse Epithelia Negative    57. Cockroach, German Negative    58. Mouse Negative    59. Tobacco Leaf Negative             Food Perc - 05/24/21 0915       Test Information   Time Antigen  Placed 0915    Allergen Manufacturer Waynette Buttery    Location Back    Number of allergen test 10      Food   1. Peanut Negative    2. Soybean food Negative    3. Wheat, whole Negative    4. Sesame Negative    5. Milk, cow Negative    6. Egg White, chicken Negative    7. Casein Negative    8. Shellfish mix Negative    9. Fish mix Negative    10. Cashew Negative             Intradermal - 05/24/21 1003     Time Antigen Placed 1003    Allergen Manufacturer Waynette Buttery    Location Arm    Number of Test 15    Control Negative    French Southern Territories Negative    Johnson 1+    7 Grass Negative    Ragweed mix 1+    Weed mix Negative    Tree mix 2+    Mold 1 2+    Mold 2 2+    Mold 3 2+    Mold 4 2+    Cat 2+    Dog Negative    Cockroach Negative    Mite mix 1+    Other Omitted             Allergy testing results were read and interpreted by myself, documented by clinical staff.         Malachi Bonds, MD [facility]

## 2021-06-01 ENCOUNTER — Telehealth: Payer: Self-pay | Admitting: *Deleted

## 2021-06-01 NOTE — Telephone Encounter (Signed)
PA is still pending.  

## 2021-06-01 NOTE — Telephone Encounter (Signed)
PA has been submitted through CoverMyMeds for Ryvent and is currently pending approval/denial.  ?

## 2021-06-02 NOTE — Telephone Encounter (Signed)
PA has been approved for Ryvent. PA has been faxed to patients pharmacy, labeled, and placed in bulk scanning.  ?

## 2021-07-03 ENCOUNTER — Ambulatory Visit: Payer: BC Managed Care – PPO | Admitting: Family Medicine

## 2021-07-03 DIAGNOSIS — J309 Allergic rhinitis, unspecified: Secondary | ICD-10-CM

## 2021-07-03 NOTE — Progress Notes (Deleted)
   [address] [place] [address] Dept: [phone]  FOLLOW UP NOTE  Patient ID: [name], female    DOB: Jun 05, 1986  Age: 35 y.o. MRN: [id number] Date of Office Visit: 07/03/2021  Assessment  Chief Complaint: No chief complaint on file.  HPI Takeyla Million is a 35 year old female who presents the clinic for follow-up visit.  She was last seen in this clinic on 05/24/2021 by Dr. Dellis Anes for evaluation of allergic rhinitis and allergic conjunctivitis.  Her last environmental skin testing was on 24 2022 was positive to grass pollen, ragweed pollen, tree pollen, mold, dust mite, and cat   Drug Allergies:  Allergies  Allergen Reactions   Depo-Provera [Medroxyprogesterone Acetate] Rash    Physical Exam: There were no vitals taken for this visit.   Physical Exam  Diagnostics:    Assessment and Plan: No diagnosis found.  No orders of the defined types were placed in this encounter.   There are no Patient Instructions on file for this visit.  No follow-ups on file.    Thank you for the opportunity to care for this patient.  Please do not hesitate to contact me with questions.  Thermon Leyland, FNP [facility]
